# Patient Record
Sex: Male | Born: 1985 | Race: Black or African American | Hispanic: No | Marital: Single | Smoking: Current every day smoker
Health system: Southern US, Community
[De-identification: ages and names within clinical notes are randomized; demographics above are authoritative.]

## PROBLEM LIST (undated history)

## (undated) DIAGNOSIS — I2699 Other pulmonary embolism without acute cor pulmonale: Secondary | ICD-10-CM

## (undated) DIAGNOSIS — R569 Unspecified convulsions: Secondary | ICD-10-CM

## (undated) HISTORY — PX: FACIAL FRACTURE SURGERY: SHX1570

---

## 2009-04-16 ENCOUNTER — Emergency Department: Payer: Self-pay | Admitting: Internal Medicine

## 2009-08-12 ENCOUNTER — Emergency Department: Payer: Self-pay | Admitting: Emergency Medicine

## 2012-04-04 ENCOUNTER — Emergency Department: Payer: Self-pay | Admitting: Emergency Medicine

## 2015-04-26 ENCOUNTER — Encounter: Payer: Self-pay | Admitting: Emergency Medicine

## 2015-04-26 ENCOUNTER — Emergency Department
Admission: EM | Admit: 2015-04-26 | Discharge: 2015-04-26 | Disposition: A | Discharge status: Home / Self Care | Payer: Self-pay | Attending: Emergency Medicine | Admitting: Emergency Medicine

## 2015-04-26 ENCOUNTER — Emergency Department: Payer: Self-pay

## 2015-04-26 DIAGNOSIS — W228XXA Striking against or struck by other objects, initial encounter: Secondary | ICD-10-CM | POA: Insufficient documentation

## 2015-04-26 DIAGNOSIS — F17200 Nicotine dependence, unspecified, uncomplicated: Secondary | ICD-10-CM | POA: Insufficient documentation

## 2015-04-26 DIAGNOSIS — S80211A Abrasion, right knee, initial encounter: Secondary | ICD-10-CM | POA: Insufficient documentation

## 2015-04-26 DIAGNOSIS — S8001XA Contusion of right knee, initial encounter: Secondary | ICD-10-CM | POA: Insufficient documentation

## 2015-04-26 DIAGNOSIS — T148XXA Other injury of unspecified body region, initial encounter: Secondary | ICD-10-CM

## 2015-04-26 DIAGNOSIS — S40011A Contusion of right shoulder, initial encounter: Secondary | ICD-10-CM | POA: Insufficient documentation

## 2015-04-26 DIAGNOSIS — F329 Major depressive disorder, single episode, unspecified: Secondary | ICD-10-CM | POA: Insufficient documentation

## 2015-04-26 DIAGNOSIS — F32A Depression, unspecified: Secondary | ICD-10-CM

## 2015-04-26 DIAGNOSIS — Y999 Unspecified external cause status: Secondary | ICD-10-CM | POA: Insufficient documentation

## 2015-04-26 DIAGNOSIS — Y9389 Activity, other specified: Secondary | ICD-10-CM | POA: Insufficient documentation

## 2015-04-26 DIAGNOSIS — Z23 Encounter for immunization: Secondary | ICD-10-CM | POA: Insufficient documentation

## 2015-04-26 DIAGNOSIS — Y929 Unspecified place or not applicable: Secondary | ICD-10-CM | POA: Insufficient documentation

## 2015-04-26 MED ORDER — IBUPROFEN 600 MG PO TABS
600.0000 mg | ORAL_TABLET | Freq: Once | ORAL | Status: AC
  Administered 2015-04-26: 600 mg via ORAL
  Filled 2015-04-26: qty 1

## 2015-04-26 MED ORDER — TETANUS-DIPHTH-ACELL PERTUSSIS 5-2.5-18.5 LF-MCG/0.5 IM SUSP
0.5000 mL | Freq: Once | INTRAMUSCULAR | Status: AC
  Administered 2015-04-26: 0.5 mL via INTRAMUSCULAR
  Filled 2015-04-26: qty 0.5
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
## 2015-04-26 NOTE — ED Notes (Signed)
Pt now also reports that he feel depressed sometimes and feels like his significant other makes him feel like he is always wrong.  He would like to talk to someone and get things off his chest.  Denies SI or HI.  Denies auditory or visual hallucinations.

## 2015-06-28 ENCOUNTER — Emergency Department: Payer: Self-pay

## 2015-06-28 ENCOUNTER — Emergency Department
Admission: EM | Admit: 2015-06-28 | Discharge: 2015-06-28 | Disposition: A | Discharge status: Home / Self Care | Payer: Self-pay | Attending: Emergency Medicine | Admitting: Emergency Medicine

## 2015-06-28 ENCOUNTER — Encounter: Payer: Self-pay | Admitting: Emergency Medicine

## 2015-06-28 DIAGNOSIS — Y929 Unspecified place or not applicable: Secondary | ICD-10-CM | POA: Insufficient documentation

## 2015-06-28 DIAGNOSIS — S93601A Unspecified sprain of right foot, initial encounter: Secondary | ICD-10-CM | POA: Insufficient documentation

## 2015-06-28 DIAGNOSIS — F17200 Nicotine dependence, unspecified, uncomplicated: Secondary | ICD-10-CM | POA: Insufficient documentation

## 2015-06-28 DIAGNOSIS — Y999 Unspecified external cause status: Secondary | ICD-10-CM | POA: Insufficient documentation

## 2015-06-28 DIAGNOSIS — W2105XA Struck by basketball, initial encounter: Secondary | ICD-10-CM | POA: Insufficient documentation

## 2015-06-28 DIAGNOSIS — Y9367 Activity, basketball: Secondary | ICD-10-CM | POA: Insufficient documentation

## 2015-06-28 MED ORDER — IBUPROFEN 800 MG PO TABS: 800.0000 mg | ORAL_TABLET | Freq: Three times a day (TID) | ORAL | Status: DC | PRN

## 2015-06-28 NOTE — ED Provider Notes (Signed)
[facility] Emergency Department Provider Note  ____________________________________________  Time seen: Approximately 11:46 AM  I have reviewed the triage vital signs and the nursing notes.   HISTORY  Chief Complaint Foot Pain    HPI Todd Ware is a 30 y.o. male, NAD, who presents to the emergency department with one-day history of right lateral foot and ankle pain. He was playing basketball around 6 or 7pm last night when he landed on the outside of his right foot and he thought he heard a 'pop'. He is able to bear weight but with pain and denies radiation of pain. He denies head trauma, LOC, or any other injuries. He tried elevating last night, but it was too painful to maintain. Took some ibuprofen with no relief. He admits to swelling and pain of the top and outside of the right foot, which is exaserbated with touch or movement. He denies fever, chills, skin discoloration, numbness, tingling, or skin temperature changes.   History reviewed. No pertinent past medical history.  There are no active problems to display for this patient.   Past Surgical History  Procedure Laterality Date  . Facial fracture surgery      Current Outpatient Rx  Name  Route  Sig  Dispense  Refill  . ibuprofen (ADVIL,MOTRIN) 800 MG tablet   Oral   Take 1 tablet (800 mg total) by mouth every 8 (eight) hours as needed (pain).   21 tablet   0     Allergies Review of patient's allergies indicates no known allergies.  No family history on file.  Social History Social History  Substance Use Topics  . Smoking status: Current Every Day Smoker  . Smokeless tobacco: None  . Alcohol Use: Yes     Review of Systems  Constitutional: No fever/chills, fatigue Cardiovascular: No chest pain. Respiratory:No shortness of breath.  Gastrointestinal: No abdominal pain.  No nausea, vomiting.  Musculoskeletal: Pain and swelling of the outside of the right foot and ankle.  Negative for back pain.  Skin: Negative for rash, redness, swelling, bruising, open wounds or lacerations. Neurological: Negative for headaches, focal weakness or numbness. No LOC, dizziness, tingling. 10-point ROS otherwise negative.  ____________________________________________   PHYSICAL EXAM:  VITAL SIGNS: ED Triage Vitals  Enc Vitals Group     BP 06/28/15 1101 106/90 mmHg     Pulse Rate 06/28/15 1101 74     Resp 06/28/15 1101 20     Temp 06/28/15 1101 97.8 F (36.6 C)     Temp Source 06/28/15 1101 Oral     SpO2 06/28/15 1101 98 %     Weight 06/28/15 1101 195 lb (88.451 kg)     Height 06/28/15 1101 6' (1.829 m)     Head Cir --      Peak Flow --      Pain Score 06/28/15 1102 10     Pain Loc --      Pain Edu? --      Excl. in GC? --      Constitutional: Alert and oriented. Well appearing and in no acute distress. Eyes: Conjunctivae are normal.  Head: Atraumatic. Cardiovascular: Good peripheral circulation with 2+ pulses noted in the right lower extremity. Capillary refill is brisk in all digits of the right lower extremity. Respiratory: Normal respiratory effort without tachypnea or retractions.  Musculoskeletal: Swelling of the dorsum and lateral edge of right foot.  Full passive ROM at ankle and toes. Active ROM at ankle and toes limited by pain.  No left lower extremity tenderness nor edema.  No joint effusions. Neurologic:  Normal speech and language. No gross focal neurologic deficits are appreciated.  Skin:  Skin is warm, dry and intact. No rash, bruising, redness, open wounds noted. Psychiatric: Mood and affect are normal. Speech and behavior are normal. Patient exhibits appropriate insight and judgement.   ____________________________________________   LABS  None ____________________________________________  EKG  None ____________________________________________  RADIOLOGY I have personally viewed and evaluated these images (plain radiographs) as part  of my medical decision making, as well as reviewing the written report by the radiologist.  Dg Ankle Complete Right  06/28/2015  CLINICAL DATA:  30 year old male with right foot and ankle pain after a fall playing basketball yesterday. Initial encounter. EXAM: RIGHT ANKLE - COMPLETE 3+ VIEW COMPARISON:  Right foot series from today reported separately. FINDINGS: Bone mineralization is within normal limits. Mortise joint alignment is preserved. Talar dome intact. Calcaneus intact. Distal tibia and fibula intact. No acute osseous abnormality identified. IMPRESSION: No osseous abnormality identified about the right ankle. Electronically Signed   By: Odessa FlemingH  Hall M.D.   On: 06/28/2015 11:56   Dg Foot Complete Right  06/28/2015  CLINICAL DATA:  30 year old male with right foot and ankle pain after a fall playing basketball yesterday. Initial encounter. EXAM: RIGHT FOOT COMPLETE - 3+ VIEW COMPARISON:  None. FINDINGS: Bone mineralization is within normal limits. Calcaneus intact. Tarsal bones appear intact. Tarsal alignment is within normal limits. Metatarsals and phalanges appear intact. No acute osseous abnormality identified. IMPRESSION: No osseous abnormality identified in the right foot. Electronically Signed   By: Odessa FlemingH  Hall M.D.   On: 06/28/2015 11:54    ____________________________________________    PROCEDURES  Procedure(s) performed: None    Medications - No data to display   ____________________________________________   INITIAL IMPRESSION / ASSESSMENT AND PLAN / ED COURSE  Pertinent imaging results that were available during my care of the patient were reviewed by me and considered in my medical decision making (see chart for details).  Patient's diagnosis is consistent with right foot sprain. Patient was placed in an Ace wrap and given crutches for supportive care and to assist with ambulation. Patient will be discharged home with prescriptions for ibuprofen to take as directed. Patient is  to apply ice to the affected area 20 minutes 3-4 times daily and elevate the affected area as needed when not ambulating. [facility] if symptoms persist past this treatment course. Patient is given ED precautions to return to the ED for any worsening or new symptoms.    ____________________________________________  FINAL CLINICAL IMPRESSION(S) / ED DIAGNOSES  Final diagnoses:  Right foot sprain, initial encounter      NEW MEDICATIONS STARTED DURING THIS VISIT:  Discharge Medication List as of 06/28/2015 12:33 PM    START taking these medications   Details  ibuprofen (ADVIL,MOTRIN) 800 MG tablet Take 1 tablet (800 mg total) by mouth every 8 (eight) hours as needed (pain)., Starting 06/28/2015, Until Discontinued, Print             Hope PigeonJami L Hagler, PA-C 06/28/15 1255  Myrna Blazeravid Matthew Schaevitz, MD 06/28/15 1536

## 2015-06-28 NOTE — ED Notes (Signed)
States he stepped wrong while playing b/b yesterday  Pain and swelling noted to lateral right foot  Positive pulse  Area very tender to touch

## 2015-06-28 NOTE — Discharge Instructions (Signed)
Elastic Bandage and RICE °WHAT DOES AN ELASTIC BANDAGE DO? °Elastic bandages come in different shapes and sizes. They generally provide support to your injury and reduce swelling while you are healing, but they can perform different functions. Your health care provider will help you to decide what is best for your protection, recovery, or rehabilitation following an injury. °WHAT ARE SOME GENERAL TIPS FOR USING AN ELASTIC BANDAGE? °· Use the bandage as directed by the maker of the bandage that you are using. °· Do not wrap the bandage too tightly. This may cut off the circulation in the arm or leg in the area below the bandage. °· If part of your body beyond the bandage becomes blue, numb, cold, swollen, or is more painful, your bandage is most likely too tight. If this occurs, remove your bandage and reapply it more loosely. °· See your health care provider if the bandage seems to be making your problems worse rather than better. °· An elastic bandage should be removed and reapplied every 3-4 hours or as directed by your health care provider. °WHAT IS RICE? °The routine care of many injuries includes rest, ice, compression, and elevation (RICE therapy).  °Rest °Rest is required to allow your body to heal. Generally, you can resume your routine activities when you are comfortable and have been given permission by your health care provider. °Ice °Icing your injury helps to keep the swelling down and it reduces pain. Do not apply ice directly to your skin. °· Put ice in a plastic bag. °· Place a towel between your skin and the bag. °· Leave the ice on for 20 minutes, 2-3 times per day. °Do this for as long as you are directed by your health care provider. °Compression °Compression helps to keep swelling down, gives support, and helps with discomfort. Compression may be done with an elastic bandage. °Elevation °Elevation helps to reduce swelling and it decreases pain. If possible, your injured area should be placed at  or above the level of your heart or the center of your chest. °WHEN SHOULD I SEEK MEDICAL CARE? °You should seek medical care if: °· You have persistent pain and swelling. °· Your symptoms are getting worse rather than improving. °These symptoms may indicate that further evaluation or further X-rays are needed. Sometimes, X-rays may not show a small broken bone (fracture) until a number of days later. Make a follow-up appointment with your health care provider. Ask when your X-ray results will be ready. Make sure that you get your X-ray results. °WHEN SHOULD I SEEK IMMEDIATE MEDICAL CARE? °You should seek immediate medical care if: °· You have a sudden onset of severe pain at or below the area of your injury. °· You develop redness or increased swelling around your injury. °· You have tingling or numbness at or below the area of your injury that does not improve after you remove the elastic bandage. °  °This information is not intended to replace advice given to you by your health care provider. Make sure you discuss any questions you have with your health care provider. °  °Document Released: 07/04/2001 Document Revised: 10/03/2014 Document Reviewed: 08/28/2013 °Elsevier Interactive Patient Education ©2016 [employer] ° °Cryotherapy °Cryotherapy is when you put ice on your injury. Ice helps lessen pain and puffiness (swelling) after an injury. Ice works the best when you start using it in the first 24 to 48 hours after an injury. °HOME CARE °· Put a dry or damp towel between the ice   pack and your skin. °· You may press gently on the ice pack. °· Leave the ice on for no more than 10 to 20 minutes at a time. °· Check your skin after 5 minutes to make sure your skin is okay. °· Rest at least 20 minutes between ice pack uses. °· Stop using ice when your skin loses feeling (numbness). °· Do not use ice on someone who cannot tell you when it hurts. This includes small children and people with memory problems  (dementia). °GET HELP RIGHT AWAY IF: °· You have white spots on your skin. °· Your skin turns blue or pale. °· Your skin feels waxy or hard. °· Your puffiness gets worse. °MAKE SURE YOU:  °· Understand these instructions. °· Will watch your condition. °· Will get help right away if you are not doing well or get worse. °  °This information is not intended to replace advice given to you by your health care provider. Make sure you discuss any questions you have with your health care provider. °  °Document Released: 07/01/2007 Document Revised: 04/06/2011 Document Reviewed: 09/04/2010 °Elsevier Interactive Patient Education ©2016 [employer] ° °Foot Sprain °A foot sprain is an injury to one of the strong bands of tissue (ligaments) that connect and support the many bones in your feet. The ligament can be stretched too much or it can tear. A tear can be either partial or complete. The severity of the sprain depends on how much of the ligament was damaged or torn. °CAUSES °A foot sprain is usually caused by suddenly twisting or pivoting your foot. °RISK FACTORS °This injury is more likely to occur in people who: °· Play a sport, such as basketball or football. °· Exercise or play a sport without warming up. °· Start a new workout or sport. °· Suddenly increase how long or hard they exercise or play a sport. °SYMPTOMS °Symptoms of this condition start soon after an injury and include: °· Pain, especially in the arch of the foot. °· Bruising. °· Swelling. °· Inability to walk or use the foot to support body weight. °DIAGNOSIS °This condition is diagnosed with a medical history and physical exam. You may also have imaging tests, such as: °· X-rays to make sure there are no broken bones (fractures). °· MRI to see if the ligament has torn. °TREATMENT °Treatment varies depending on the severity of your sprain. Mild sprains can be treated with rest, ice, compression, and elevation (RICE). If your ligament is overstretched or  partially torn, treatment usually involves keeping your foot in a fixed position (immobilization) for a period of time. To help you do this, your health care provider will apply a bandage, splint, or walking boot to keep your foot from moving until it heals. You may also be advised to use crutches or a scooter for a few weeks to avoid bearing weight on your foot while it is healing. °If your ligament is fully torn, you may need surgery to reconnect the ligament to the bone. After surgery, a cast or splint will be applied and will need to stay on your foot while it heals. °Your health care provider may also suggest exercises or physical therapy to strengthen your foot. °HOME CARE INSTRUCTIONS °If You Have a Bandage, Splint, or Walking Boot: °· Wear it as directed by your health care provider. Remove it only as directed by your health care provider. °· Loosen the bandage, splint, or walking boot if your toes become numb and tingle, or   if they turn cold and blue. Bathing  If your health care provider approves bathing and showering, cover the bandage or splint with a watertight plastic bag to protect it from water. Do not let the bandage or splint get wet. Managing Pain, Stiffness, and Swelling   If directed, apply ice to the injured area:  Put ice in a plastic bag.  Place a towel between your skin and the bag.  Leave the ice on for 20 minutes, 2-3 times per day.  Move your toes often to avoid stiffness and to lessen swelling.  Raise (elevate) the injured area above the level of your heart while you are sitting or lying down. Driving  Do not drive or operate heavy machinery while taking pain medicine.  Do not drive while wearing a bandage, splint, or walking boot on a foot that you use for driving. Activity  Rest as directed by your health care provider.  Do not use the injured foot to support your body weight until your health care provider says that you can. Use crutches or other supportive  devices as directed by your health care provider.  Ask your health care provider what activities are safe for you. Gradually increase how much and how far you walk until your health care provider says it is safe to return to full activity.  Do any exercise or physical therapy as directed by your health care provider. General Instructions  If a splint was applied, do not put pressure on any part of it until it is fully hardened. This may take several hours.  Take medicines only as directed by your health care provider. These include over-the-counter medicines and prescription medicines.  Keep all follow-up visits as directed by your health care provider. This is important.  When you can walk without pain, wear supportive shoes that have stiff soles. Do not wear flip-flops, and do not walk barefoot. SEEK MEDICAL CARE IF:  Your pain is not controlled with medicine.  Your bruising or swelling gets worse or does not get better with treatment.  Your splint or walking boot is damaged. SEEK IMMEDIATE MEDICAL CARE IF:  Your foot is numb or blue.  Your foot feels colder than normal.   This information is not intended to replace advice given to you by your health care provider. Make sure you discuss any questions you have with your health care provider.   Document Released: 07/04/2001 Document Revised: 05/29/2014 Document Reviewed: 11/15/2013 Elsevier Interactive Patient Education [employer]

## 2015-06-28 NOTE — ED Notes (Signed)
Pt verbalized understanding of discharge instructions. NAD at this time. 

## 2016-09-01 ENCOUNTER — Emergency Department: Payer: Self-pay

## 2016-09-01 ENCOUNTER — Encounter: Payer: Self-pay | Admitting: *Deleted

## 2016-09-01 ENCOUNTER — Emergency Department
Admission: EM | Admit: 2016-09-01 | Discharge: 2016-09-01 | Disposition: A | Discharge status: Home / Self Care | Payer: Self-pay | Attending: Emergency Medicine | Admitting: Emergency Medicine

## 2016-09-01 DIAGNOSIS — R0789 Other chest pain: Secondary | ICD-10-CM | POA: Insufficient documentation

## 2016-09-01 DIAGNOSIS — F17200 Nicotine dependence, unspecified, uncomplicated: Secondary | ICD-10-CM | POA: Insufficient documentation

## 2016-09-01 LAB — BASIC METABOLIC PANEL
ANION GAP: 9 (ref 5–15)
BUN: 16 mg/dL (ref 6–20)
CALCIUM: 9.1 mg/dL (ref 8.9–10.3)
CHLORIDE: 103 mmol/L (ref 101–111)
CO2: 27 mmol/L (ref 22–32)
Creatinine, Ser: 1 mg/dL (ref 0.61–1.24)
GFR calc non Af Amer: >60 mL/min (ref >60)
Glucose, Bld: 90 mg/dL (ref 65–99)
Potassium: 3.7 mmol/L (ref 3.5–5.1)
SODIUM: 139 mmol/L (ref 135–145)

## 2016-09-01 LAB — CBC
HCT: 46.9 % (ref 40.0–52.0)
HEMOGLOBIN: 15.9 g/dL (ref 13.0–18.0)
MCH: 30.4 pg (ref 26.0–34.0)
MCHC: 33.9 g/dL (ref 32.0–36.0)
MCV: 89.8 fL (ref 80.0–100.0)
Platelets: 157 10*3/uL (ref 150–440)
RBC: 5.23 MIL/uL (ref 4.40–5.90)
RDW: 13.6 % (ref 11.5–14.5)
WBC: 8.5 10*3/uL (ref 3.8–10.6)

## 2016-09-01 LAB — TROPONIN I: Troponin I: <0.03 ng/mL (ref <0.03)

## 2016-09-01 MED ORDER — IBUPROFEN 600 MG PO TABS: 600.0000 mg | ORAL_TABLET | Freq: Three times a day (TID) | ORAL | 0 refills | Status: DC | PRN

## 2016-09-01 MED ORDER — IBUPROFEN 600 MG PO TABS
600.0000 mg | ORAL_TABLET | Freq: Once | ORAL | Status: AC
  Administered 2016-09-01: 600 mg via ORAL
  Filled 2016-09-01: qty 1

## 2016-09-01 NOTE — ED Triage Notes (Signed)
Pt brought in via ems.  Pt has chest pain and sob for several months   States pain is in left chest.  nonradiaiting pain   Pt reports pain with deep breath.  Pt alert.

## 2016-09-01 NOTE — Discharge Instructions (Signed)
Fortunately today her blood work and her chest x-ray were normal. Please take your pain medication as needed as prescribed and wants to return to [place] tomorrow and he can appointment to follow-up with your primary care physician this week for recheck. Return to the emergency department sooner for any concerns.  It was a pleasure to take care of you today, and thank you for coming to our emergency department.  If you have any questions or concerns before leaving please ask the nurse to grab me and I'm more than happy to go through your aftercare instructions again.  If you were prescribed any opioid pain medication today such as Norco, Vicodin, Percocet, morphine, hydrocodone, or oxycodone please make sure you do not drive when you are taking this medication as it can alter your ability to drive safely.  If you have any concerns once you are home that you are not improving or are in fact getting worse before you can make it to your follow-up appointment, please do not hesitate to call 911 and come back for further evaluation.  Merrily BrittleNeil Aleczander Fandino, MD  Results for orders placed or performed during the hospital encounter of 09/01/16  Basic metabolic panel  Result Value Ref Range   Sodium 139 135 - 145 mmol/L   Potassium 3.7 3.5 - 5.1 mmol/L   Chloride 103 101 - 111 mmol/L   CO2 27 22 - 32 mmol/L   Glucose, Bld 90 65 - 99 mg/dL   BUN 16 6 - 20 mg/dL   Creatinine, Ser 1.611.00 0.61 - 1.24 mg/dL   Calcium 9.1 8.9 - 09.610.3 mg/dL   GFR calc non Af Amer >60 >60 mL/min   GFR calc Af Amer >60 >60 mL/min   Anion gap 9 5 - 15  CBC  Result Value Ref Range   WBC 8.5 3.8 - 10.6 K/uL   RBC 5.23 4.40 - 5.90 MIL/uL   Hemoglobin 15.9 13.0 - 18.0 g/dL   HCT 04.546.9 40.940.0 - 81.152.0 %   MCV 89.8 80.0 - 100.0 fL   MCH 30.4 26.0 - 34.0 pg   MCHC 33.9 32.0 - 36.0 g/dL   RDW 91.413.6 78.211.5 - 95.614.5 %   Platelets 157 150 - 440 K/uL  Troponin I  Result Value Ref Range   Troponin I <0.03 <0.03 ng/mL   Dg Chest 2 View  Result  Date: 09/01/2016 CLINICAL DATA:  Chronic left-sided chest pain and shortness of breath. Initial encounter. EXAM: CHEST  2 VIEW COMPARISON:  None. FINDINGS: The lungs are well-aerated and clear. There is no evidence of focal opacification, pleural effusion or pneumothorax. The heart is normal in size; the mediastinal contour is within normal limits. No acute osseous abnormalities are seen. IMPRESSION: No acute cardiopulmonary process seen. Electronically Signed   By: Roanna RaiderJeffery  Chang M.D.   On: 09/01/2016 02:23

## 2016-09-01 NOTE — ED Notes (Signed)
ED Provider at bedside. 

## 2016-09-01 NOTE — ED Provider Notes (Signed)
[facility] Emergency Department Provider Note  ____________________________________________   First MD Initiated Contact with Patient 09/01/16 0405     (approximate)  I have reviewed the triage vital signs and the nursing notes.   HISTORY  Chief Complaint Shortness of Breath   HPI Todd Ware is a 31 y.o. male who self presents to the emergency department with 2-3 weeks of squeezing upper chest pain. The pain is difficult for him to quantify. It is associated with mild shortness of breath. He has no history of DVT or pulmonary embolism. He lives in [place] and has recently been in [place]. He said that it one year ago he had a similar episode and was told that he needed to follow up with the cardiologist however he never did. He denies syncope. His pain is nonradiating. Nothing seems to make it better or worse. It is nonexertional.   No past medical history on file.  There are no active problems to display for this patient.   No past surgical history on file.  Prior to Admission medications   Medication Sig Start Date End Date Taking? Authorizing Provider  ibuprofen (ADVIL,MOTRIN) 600 MG tablet Take 1 tablet (600 mg total) by mouth every 8 (eight) hours as needed. 09/01/16   Merrily Brittle, MD    Allergies Patient has no known allergies.  No family history on file.  Social History Social History  Substance Use Topics  . Smoking status: Current Every Day Smoker  . Smokeless tobacco: Never Used  . Alcohol use No    Review of Systems Constitutional: No fever/chills ENT: No sore throat. Cardiovascular: Positive chest pain. Respiratory: Positive shortness of breath. Gastrointestinal: No abdominal pain.  No nausea, no vomiting.  No diarrhea.  No constipation. Musculoskeletal: Negative for back pain. Neurological: Negative for headaches   ____________________________________________   PHYSICAL EXAM:  VITAL SIGNS: ED Triage  Vitals  Enc Vitals Group     BP 09/01/16 0153 132/83     Pulse Rate 09/01/16 0153 82     Resp 09/01/16 0153 18     Temp 09/01/16 0153 98.3 F (36.8 C)     Temp Source 09/01/16 0153 Oral     SpO2 09/01/16 0153 98 %     Weight 09/01/16 0147 168 lb (76.2 kg)     Height 09/01/16 0147 6' (1.829 m)     Head Circumference --      Peak Flow --      Pain Score 09/01/16 0146 10     Pain Loc --      Pain Edu? --      Excl. in GC? --     Constitutional: Alert and oriented 4 pleasant cooperative speaks full clear sentences Head: Atraumatic. Nose: No congestion/rhinnorhea. Mouth/Throat: No trismus Neck: No stridor.   Cardiovascular: Regular rate and rhythm no murmurs Respiratory: Normal respiratory effort.  No retractions. Musculoskeletal: Legs are equal in size no edema Neurologic:  Normal speech and language. No gross focal neurologic deficits are appreciated.  Skin:  Skin is warm, dry and intact. No rash noted.    ____________________________________________  LABS (all labs ordered are listed, but only abnormal results are displayed)  Labs Reviewed  BASIC METABOLIC PANEL  CBC  TROPONIN I    No signs of acute ischemia __________________________________________  EKG  ED ECG REPORT I, Merrily Brittle, the attending physician, personally viewed and interpreted this ECG.  Date: 09/01/2016 Rate: 73 Rhythm: normal sinus rhythm QRS Axis: normal Intervals:  normal ST/T Wave abnormalities: normal Narrative Interpretation: Incomplete right bundle branch block but no Brugada pattern borderline EKG  ____________________________________________  RADIOLOGY  Chest x-ray with no acute disease ____________________________________________   PROCEDURES  Procedure(s) performed: no  Procedures  Critical Care performed: no  Observation: no ____________________________________________   INITIAL IMPRESSION / ASSESSMENT AND PLAN / ED COURSE  Pertinent labs & imaging results  that were available during my care of the patient were reviewed by me and considered in my medical decision making (see chart for details).  The patient is very well-appearing with atypical chest pain. I appreciate his incomplete right bundle branch block but this is nonspecific. He is PERC negative.  Doubt myocardial ischemia. He is leaving for [place] tomorrow and I encouraged him to reestablish care with his primary care physician once he arrives. He is discharged home in improved condition.      ____________________________________________   FINAL CLINICAL IMPRESSION(S) / ED DIAGNOSES  Final diagnoses:  Atypical chest pain      NEW MEDICATIONS STARTED DURING THIS VISIT:  Discharge Medication List as of 09/01/2016  5:23 AM    START taking these medications   Details  ibuprofen (ADVIL,MOTRIN) 600 MG tablet Take 1 tablet (600 mg total) by mouth every 8 (eight) hours as needed., Starting Tue 09/01/2016, Print         Note:  This document was prepared using Dragon voice recognition software and may include unintentional dictation errors.      Merrily Brittleifenbark, Ishana Blades, MD 09/01/16 [phone]

## 2016-09-02 ENCOUNTER — Encounter: Payer: Self-pay | Admitting: *Deleted

## 2017-05-26 ENCOUNTER — Encounter: Payer: Self-pay | Admitting: Emergency Medicine

## 2017-05-26 ENCOUNTER — Emergency Department
Admission: EM | Admit: 2017-05-26 | Discharge: 2017-05-26 | Disposition: A | Discharge status: Home / Self Care | Payer: Self-pay | Attending: Emergency Medicine | Admitting: Emergency Medicine

## 2017-05-26 ENCOUNTER — Emergency Department: Payer: Self-pay

## 2017-05-26 ENCOUNTER — Other Ambulatory Visit: Payer: Self-pay

## 2017-05-26 DIAGNOSIS — F17200 Nicotine dependence, unspecified, uncomplicated: Secondary | ICD-10-CM | POA: Insufficient documentation

## 2017-05-26 DIAGNOSIS — L03116 Cellulitis of left lower limb: Secondary | ICD-10-CM | POA: Insufficient documentation

## 2017-05-26 LAB — CBC WITH DIFFERENTIAL/PLATELET
BASOS ABS: 0 10*3/uL (ref 0–0.1)
BASOS PCT: 0 %
Eosinophils Absolute: 0 10*3/uL (ref 0–0.7)
Eosinophils Relative: 0 %
HCT: 42.4 % (ref 40.0–52.0)
Hemoglobin: 14.1 g/dL (ref 13.0–18.0)
LYMPHS PCT: 34 %
Lymphs Abs: 1.1 10*3/uL (ref 1.0–3.6)
MCH: 30.3 pg (ref 26.0–34.0)
MCHC: 33.2 g/dL (ref 32.0–36.0)
MCV: 91.3 fL (ref 80.0–100.0)
Monocytes Absolute: 0.3 10*3/uL (ref 0.2–1.0)
Monocytes Relative: 8 %
NEUTROS ABS: 1.9 10*3/uL (ref 1.4–6.5)
Neutrophils Relative %: 58 %
Platelets: 139 10*3/uL — ABNORMAL LOW (ref 150–440)
RBC: 4.65 MIL/uL (ref 4.40–5.90)
RDW: 13.9 % (ref 11.5–14.5)
WBC: 3.3 10*3/uL — AB (ref 3.8–10.6)

## 2017-05-26 LAB — BASIC METABOLIC PANEL
ANION GAP: 6 (ref 5–15)
BUN: 7 mg/dL (ref 6–20)
CALCIUM: 8.3 mg/dL — AB (ref 8.9–10.3)
CO2: 28 mmol/L (ref 22–32)
Chloride: 103 mmol/L (ref 101–111)
Creatinine, Ser: 0.69 mg/dL (ref 0.61–1.24)
GFR calc non Af Amer: >60 mL/min (ref >60)
GLUCOSE: 96 mg/dL (ref 65–99)
POTASSIUM: 3.7 mmol/L (ref 3.5–5.1)
Sodium: 137 mmol/L (ref 135–145)

## 2017-05-26 LAB — LACTIC ACID, PLASMA: LACTIC ACID, VENOUS: 0.7 mmol/L (ref 0.5–1.9)

## 2017-05-26 MED ORDER — CLINDAMYCIN PHOSPHATE 600 MG/50ML IV SOLN
600.0000 mg | Freq: Once | INTRAVENOUS | Status: AC
  Administered 2017-05-26: 600 mg via INTRAVENOUS
  Filled 2017-05-26: qty 50

## 2017-05-26 MED ORDER — OXYCODONE HCL 5 MG PO TABS
5.0000 mg | ORAL_TABLET | Freq: Once | ORAL | Status: AC
  Administered 2017-05-26: 5 mg via ORAL
  Filled 2017-05-26: qty 1

## 2017-05-26 MED ORDER — ACETAMINOPHEN 500 MG PO TABS
1000.0000 mg | ORAL_TABLET | Freq: Once | ORAL | Status: AC
  Administered 2017-05-26: 1000 mg via ORAL
  Filled 2017-05-26: qty 2

## 2017-05-26 MED ORDER — CLINDAMYCIN HCL 150 MG PO CAPS: 450.0000 mg | ORAL_CAPSULE | Freq: Three times a day (TID) | ORAL | 0 refills | Status: AC

## 2017-05-26 NOTE — ED Notes (Signed)
First Nurse Note:  Patient ambulatory to Rm 9, Tresa Endo RN aware of placement.

## 2017-05-26 NOTE — ED Notes (Signed)
Pt's care discussed with Dr. Alphonzo Lemmings, see orders.
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
## 2017-05-26 NOTE — ED Notes (Signed)
Pt taken to lobby to wait for ride in wheelchair. VSS. NAD. Discharge instructions, RX and follow up discussed with pt. All questions answered.

## 2017-05-26 NOTE — ED Triage Notes (Signed)
Pt presents to ED via POV with c/o L calf swelling and pain x 2 weeks. Pt states pain worse with ambulation at this time. Pt states pain became unbearable earlier today.

## 2017-05-26 NOTE — ED Notes (Addendum)
Pt with significant swelling to L calf with noted rash, redness noted as well.

## 2017-09-23 IMAGING — CR DG CHEST 2V
2 series · 2 of 2 positions shown · non-contrast
Comparison: None.

CLINICAL DATA: Chronic left-sided chest pain and shortness of
breath. Initial encounter.

EXAM:
CHEST  2 VIEW

[chest pa]
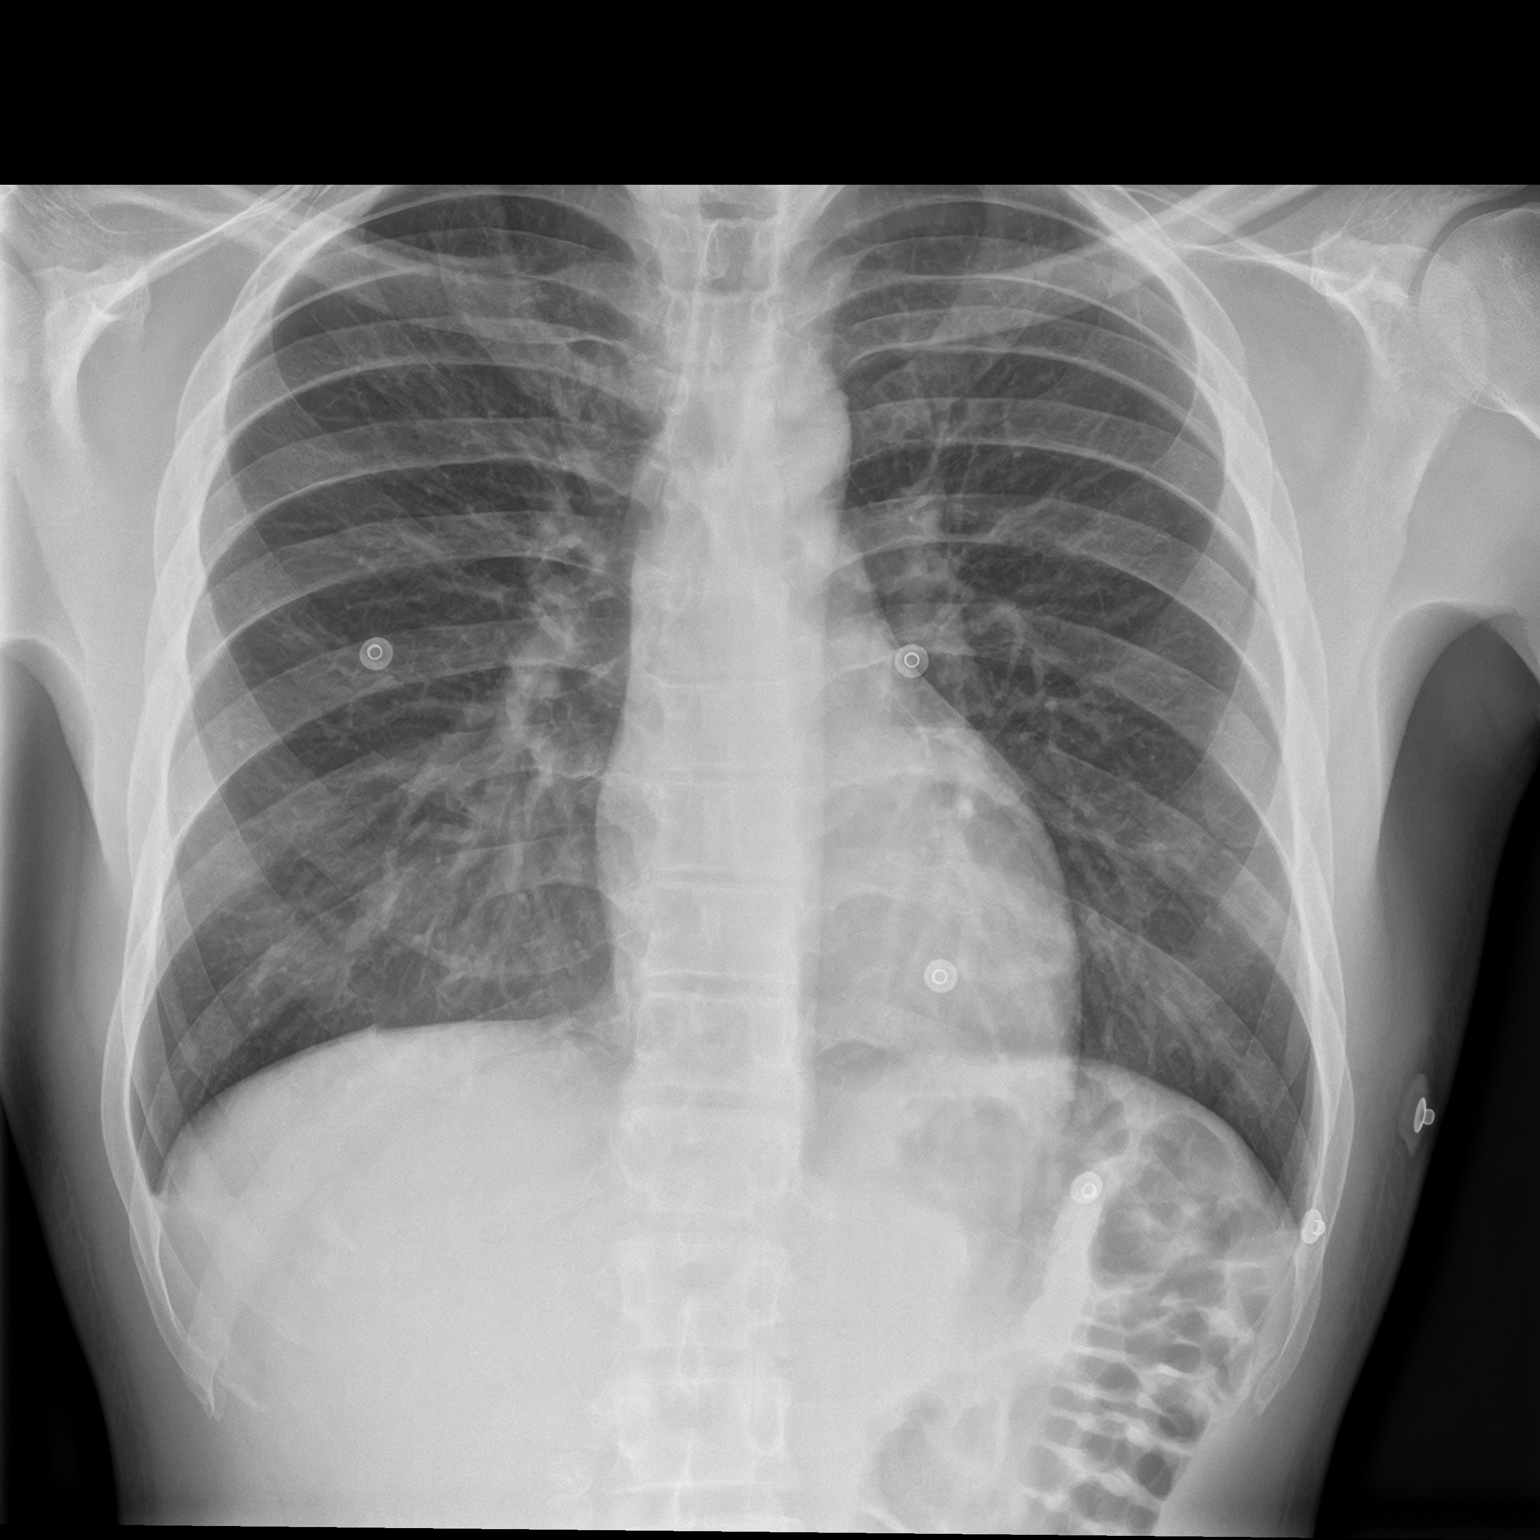

[chest lat]
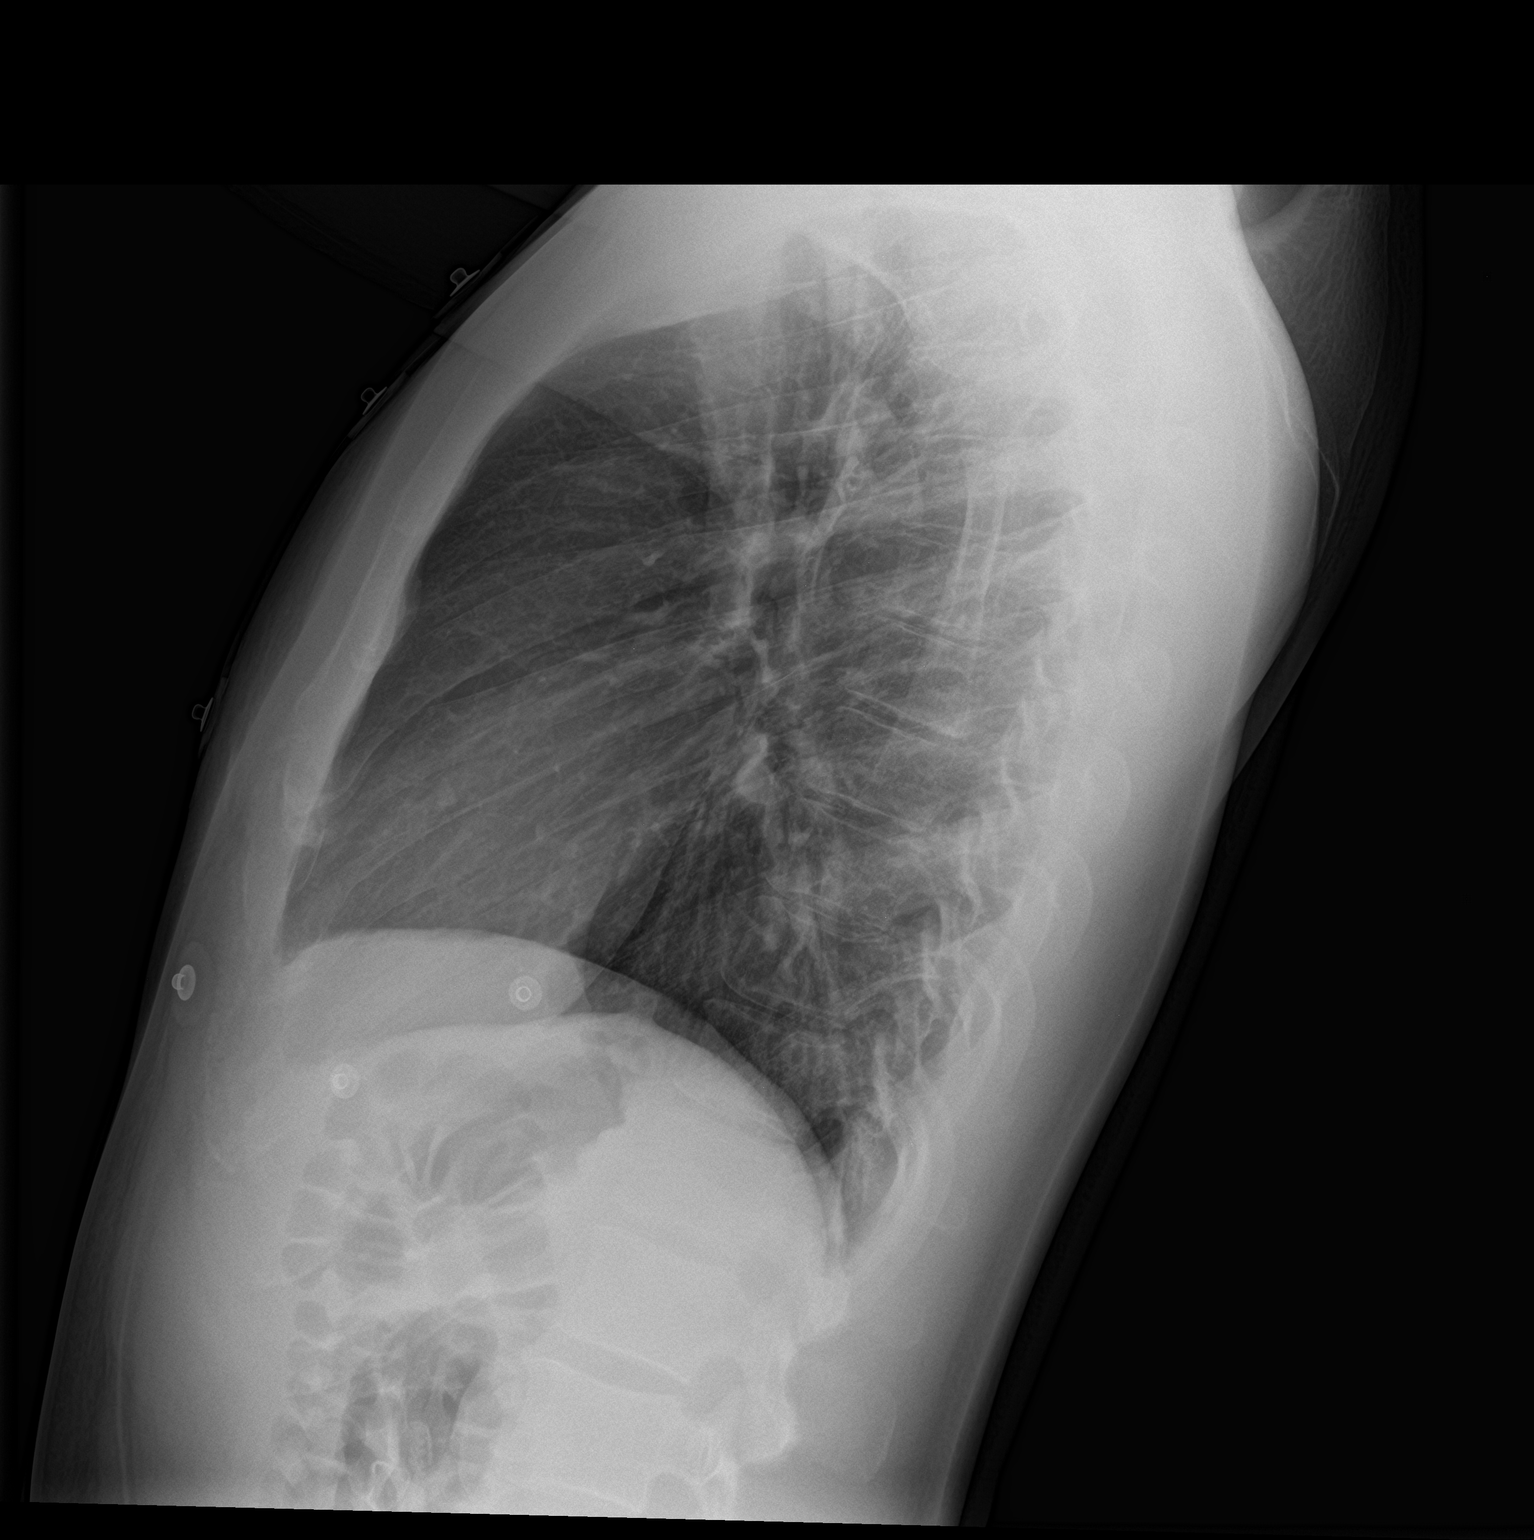

[2 of 2 positions shown; findings below may reference images not displayed]

FINDINGS: The lungs are well-aerated and clear. There is no evidence of focal
opacification, pleural effusion or pneumothorax.

The heart is normal in size; the mediastinal contour is within
normal limits. No acute osseous abnormalities are seen.
IMPRESSION: No acute cardiopulmonary process seen.

## 2018-08-01 ENCOUNTER — Ambulatory Visit: Payer: Self-pay

## 2018-08-10 ENCOUNTER — Other Ambulatory Visit: Payer: Self-pay

## 2018-08-10 ENCOUNTER — Emergency Department
Admission: EM | Admit: 2018-08-10 | Discharge: 2018-08-10 | Disposition: A | Discharge status: Other Acute Inpatient Hospital | Payer: Self-pay | Attending: Emergency Medicine | Admitting: Emergency Medicine

## 2018-08-10 ENCOUNTER — Inpatient Hospital Stay: Admission: RE | Admit: 2018-08-10 | Payer: Self-pay | Source: Intra-hospital | Admitting: Psychiatry

## 2018-08-10 ENCOUNTER — Inpatient Hospital Stay
Admission: AD | Admit: 2018-08-10 | Discharge: 2018-08-12 | DRG: 885 | Disposition: A | Discharge status: Home / Self Care | Payer: No Typology Code available for payment source | Source: Intra-hospital | Attending: Psychiatry | Admitting: Psychiatry

## 2018-08-10 ENCOUNTER — Encounter: Payer: Self-pay | Admitting: Emergency Medicine

## 2018-08-10 DIAGNOSIS — G4700 Insomnia, unspecified: Secondary | ICD-10-CM | POA: Diagnosis present

## 2018-08-10 DIAGNOSIS — Z62810 Personal history of physical and sexual abuse in childhood: Secondary | ICD-10-CM | POA: Diagnosis present

## 2018-08-10 DIAGNOSIS — F3112 Bipolar disorder, current episode manic without psychotic features, moderate: Principal | ICD-10-CM | POA: Diagnosis present

## 2018-08-10 DIAGNOSIS — F319 Bipolar disorder, unspecified: Secondary | ICD-10-CM

## 2018-08-10 DIAGNOSIS — Z20828 Contact with and (suspected) exposure to other viral communicable diseases: Secondary | ICD-10-CM | POA: Insufficient documentation

## 2018-08-10 DIAGNOSIS — F17210 Nicotine dependence, cigarettes, uncomplicated: Secondary | ICD-10-CM | POA: Insufficient documentation

## 2018-08-10 DIAGNOSIS — F250 Schizoaffective disorder, bipolar type: Secondary | ICD-10-CM | POA: Insufficient documentation

## 2018-08-10 DIAGNOSIS — Z590 Homelessness: Secondary | ICD-10-CM | POA: Diagnosis not present

## 2018-08-10 DIAGNOSIS — F411 Generalized anxiety disorder: Secondary | ICD-10-CM | POA: Diagnosis present

## 2018-08-10 DIAGNOSIS — R45851 Suicidal ideations: Secondary | ICD-10-CM | POA: Diagnosis present

## 2018-08-10 DIAGNOSIS — R45850 Homicidal ideations: Secondary | ICD-10-CM | POA: Insufficient documentation

## 2018-08-10 LAB — SALICYLATE LEVEL: Salicylate Lvl: <7 mg/dL (ref 2.8–30.0)

## 2018-08-10 LAB — CBC
HCT: 47.6 % (ref 39.0–52.0)
Hemoglobin: 15.8 g/dL (ref 13.0–17.0)
MCH: 29.9 pg (ref 26.0–34.0)
MCHC: 33.2 g/dL (ref 30.0–36.0)
MCV: 90.2 fL (ref 80.0–100.0)
Platelets: 149 10*3/uL — ABNORMAL LOW (ref 150–400)
RBC: 5.28 MIL/uL (ref 4.22–5.81)
RDW: 13.2 % (ref 11.5–15.5)
WBC: 6.4 10*3/uL (ref 4.0–10.5)
nRBC: 0 % (ref 0.0–0.2)

## 2018-08-10 LAB — ACETAMINOPHEN LEVEL: Acetaminophen (Tylenol), Serum: <10 ug/mL — ABNORMAL LOW (ref 10–30)

## 2018-08-10 LAB — COMPREHENSIVE METABOLIC PANEL
ALT: 11 U/L (ref 0–44)
AST: 19 U/L (ref 15–41)
Albumin: 3.7 g/dL (ref 3.5–5.0)
Alkaline Phosphatase: 47 U/L (ref 38–126)
Anion gap: 8 (ref 5–15)
BUN: 8 mg/dL (ref 6–20)
CO2: 27 mmol/L (ref 22–32)
Calcium: 9 mg/dL (ref 8.9–10.3)
Chloride: 104 mmol/L (ref 98–111)
Creatinine, Ser: 0.83 mg/dL (ref 0.61–1.24)
GFR calc Af Amer: >60 mL/min (ref >60)
GFR calc non Af Amer: >60 mL/min (ref >60)
Glucose, Bld: 104 mg/dL — ABNORMAL HIGH (ref 70–99)
Potassium: 3.6 mmol/L (ref 3.5–5.1)
Sodium: 139 mmol/L (ref 135–145)
Total Bilirubin: 1 mg/dL (ref 0.3–1.2)
Total Protein: 6.4 g/dL — ABNORMAL LOW (ref 6.5–8.1)

## 2018-08-10 LAB — ETHANOL: Alcohol, Ethyl (B): <10 mg/dL (ref <10)

## 2018-08-10 LAB — SARS CORONAVIRUS 2 BY RT PCR (HOSPITAL ORDER, PERFORMED IN ~~LOC~~ HOSPITAL LAB): SARS Coronavirus 2: NEGATIVE

## 2018-08-10 MED ORDER — DIAZEPAM 5 MG PO TABS
10.0000 mg | ORAL_TABLET | Freq: Once | ORAL | Status: AC
  Administered 2018-08-10: 14:00:00 10 mg via ORAL
  Filled 2018-08-10: qty 2

## 2018-08-10 MED ORDER — LORAZEPAM 2 MG PO TABS
2.0000 mg | ORAL_TABLET | Freq: Once | ORAL | Status: AC
  Administered 2018-08-10: 2 mg via ORAL
  Filled 2018-08-10: qty 1

## 2018-08-10 MED ORDER — ALUM & MAG HYDROXIDE-SIMETH 200-200-20 MG/5ML PO SUSP: 30.0000 mL | ORAL | Status: DC | PRN

## 2018-08-10 MED ORDER — HALOPERIDOL LACTATE 5 MG/ML IJ SOLN: 20.0000 mg | Freq: Once | INTRAMUSCULAR | Status: DC

## 2018-08-10 MED ORDER — ACETAMINOPHEN 325 MG PO TABS: 650.0000 mg | ORAL_TABLET | Freq: Four times a day (QID) | ORAL | Status: DC | PRN

## 2018-08-10 MED ORDER — MAGNESIUM HYDROXIDE 400 MG/5ML PO SUSP: 30.0000 mL | Freq: Every day | ORAL | Status: DC | PRN

## 2018-08-10 MED ORDER — ZIPRASIDONE MESYLATE 20 MG IM SOLR
20.0000 mg | Freq: Once | INTRAMUSCULAR | Status: DC
  Filled 2018-08-10: qty 20

## 2018-08-10 NOTE — ED Notes (Signed)
Pt given crackers.  

## 2018-08-10 NOTE — ED Notes (Signed)
Pt talking on phone and appears very calm and collected. Pt laughing with person on the phone. Laying down in NAD. Meal tray given.
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
## 2018-08-10 NOTE — ED Notes (Signed)
Pt discharged to BMU under IVC. VS stable. All belongings sent with patient. Report given to Cristie Hem, RN.
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
## 2018-08-10 NOTE — ED Notes (Signed)
Snack tray left at bedside, pt resting.

## 2018-08-10 NOTE — ED Notes (Signed)
Pt comes out of room and is upset why he is still here. Pt becomes very agitated and starts yelling when this RN begins to explain things. Pt often yells over this RN many times and asked for psychiatrist. Pt wants to leave and expresses that psychiatrist wanted him to go home. This RN explains that sometimes it's a wait for paperwork etc. Marya Amsler, RN and Calvin to bedside to mediate with patient.
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
## 2018-08-10 NOTE — BH Assessment (Signed)
Patient is to be admitted to [facility] by Dr. Einar Grad.  Attending Physician will be Dr. Einar Grad.   Patient has been assigned to room 307, by Violet Nurse 307.   ER staff is aware of the admission:  Glenda, ER Secretary    Dr. Cinda Quest, ER MD   Clarita Crane) Patient's Nurse   Gust Rung., Patient Access.

## 2018-08-10 NOTE — ED Notes (Signed)
[employer] officer Duwayne Heck who rode with patient [facility] agreed to stay with patient due to SI/HI that was voiced at [facility]. Pt to be dressed out by Nicki Reaper, EDT, and placed in family room with [employer] officer Duwayne Heck at this time.
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
## 2018-08-10 NOTE — ED Notes (Signed)
Pt dressed out. Pt belongings bag:  1 blue lighter 1 blue bandana 1 Photo ID 1 Bank card 1 Food Card (EBT) 1 grey shirt 1 grey sweatshirt 1 red shorts 1 pair shoes 1 pair socks

## 2018-08-10 NOTE — ED Notes (Signed)
Pt repeats "I don't know" many times when asked questions. Pt states that he doesn't really know why he is here. He says that he just cannot control his emotions anymore. He says his depression is really bad, although he denies any SI/HI. He says that he doesn't sleep any more than an hour at a time. He speaks very rapidly and repeats many things. He states that he wants some help. He states that hs ex girlfriend will be able to give a better story, and he says that she has told him many times that she thinks he "is bipolar." He is calm, cooperative. Oriented to bathroom, trashcan, meal times, phone privileges, etc and is agreeable to them. Eating a meal tray at this time.
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
## 2018-08-11 ENCOUNTER — Other Ambulatory Visit: Payer: Self-pay

## 2018-08-11 DIAGNOSIS — F319 Bipolar disorder, unspecified: Secondary | ICD-10-CM

## 2018-08-11 MED ORDER — LORAZEPAM 1 MG PO TABS
1.0000 mg | ORAL_TABLET | ORAL | Status: AC
  Administered 2018-08-11: 1 mg via ORAL
  Filled 2018-08-11: qty 1

## 2018-08-11 MED ORDER — QUETIAPINE FUMARATE 100 MG PO TABS
100.0000 mg | ORAL_TABLET | Freq: Every day | ORAL | Status: DC
  Filled 2018-08-11: qty 1

## 2018-08-11 MED ORDER — LORAZEPAM 1 MG PO TABS: 1.0000 mg | ORAL_TABLET | Freq: Four times a day (QID) | ORAL | Status: DC | PRN

## 2018-08-11 MED ORDER — LORAZEPAM 1 MG PO TABS: 1.0000 mg | ORAL_TABLET | ORAL | Status: DC | PRN

## 2018-08-11 MED ORDER — QUETIAPINE FUMARATE 100 MG PO TABS: 100.0000 mg | ORAL_TABLET | Freq: Every day | ORAL | 0 refills | Status: DC

## 2018-08-11 NOTE — BHH Counselor (Signed)
CSW attempted to complete PSA 2x this morning with pt. Pt was sleeping and unable to be roused.  Evalina Field, MSW, LCSW Clinical Social Work 08/11/2018 10:00 AM

## 2018-08-11 NOTE — Progress Notes (Signed)
Recreation Therapy Notes   Date: 08/11/2018  Time: 9:30 am   Location: Craft room   Behavioral response: N/A   Intervention Topic: Self-esteem  Discussion/Intervention: Patient did not attend group.   Clinical Observations/Feedback:  Patient did not attend group.   Arris Meyn LRT/CTRS        Aarion Metzgar 08/11/2018 10:28 AM

## 2018-08-11 NOTE — BHH Group Notes (Signed)
LCSW Group Therapy Note  08/11/2018 1:00 PM  Type of Therapy/Topic:  Group Therapy:  Balance in Life  Participation Level:  Did Not Attend  Description of Group:    This group will address the concept of balance and how it feels and looks when one is unbalanced. Patients will be encouraged to process areas in their lives that are out of balance and identify reasons for remaining unbalanced. Facilitators will guide patients in utilizing problem-solving interventions to address and correct the stressor making their life unbalanced. Understanding and applying boundaries will be explored and addressed for obtaining and maintaining a balanced life. Patients will be encouraged to explore ways to assertively make their unbalanced needs known to significant others in their lives, using other group members and facilitator for support and feedback.  Therapeutic Goals: 1. Patient will identify two or more emotions or situations they have that consume much of in their lives. 2. Patient will identify signs/triggers that life has become out of balance:  3. Patient will identify two ways to set boundaries in order to achieve balance in their lives:  4. Patient will demonstrate ability to communicate their needs through discussion and/or role plays  Summary of Patient Progress: X  Therapeutic Modalities:   Cognitive Behavioral Therapy Solution-Focused Therapy Assertiveness Training  Jebadiah Imperato MSW, LCSW 08/11/2018 10:37 AM

## 2018-08-11 NOTE — Progress Notes (Signed)
Patient denies SI/HI, denies A/V hallucinations. Patient verbalizes understanding of discharge instructions, follow up care and prescriptions.7 days medicines given to patient. Patient given all belongings from [facility] locker. Patient escorted out by staff, transported by family. 

## 2018-08-11 NOTE — Plan of Care (Signed)
Patient was irritable,sleepy and drowsy this morning.Did not eat breakfast or lunch.Patient was asking for discharge and medicine to calm down.Ativan given.Patient is appropriate at this time.Denies SI,HI and AVH.Support and encouragement given.
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
## 2018-08-11 NOTE — BHH Suicide Risk Assessment (Signed)
[facility] Admission Suicide Risk Assessment   Nursing information obtained from:  Patient Demographic factors:  NA Current Mental Status:  NA Loss Factors:  NA Historical Factors:  NA Risk Reduction Factors:  NA  Total Time spent with patient: 1 hour Principal Problem: Bipolar depression ([place]) Diagnosis:  Principal Problem:   Bipolar depression ([place]) Active Problems:   Bipolar 1 disorder with moderate mania ([place])  Subjective Data: Patient seen and chart reviewed.  Patient came to the emergency room initially as a voluntary patient with complaints of acute on chronic mood instability anxiety racing thoughts irritability.  Patient is currently in a stressful situation and only recently became homeless.  He describes symptoms of poor sleep nightly with nightmares, intense social anxiety, irritable mood.  He does not report any clearly psychotic symptoms.  He denies recent alcohol or drug abuse.  The patient denies any suicidal thoughts wish or plan.  Denies any violent thought or wish.  Continued Clinical Symptoms:  Alcohol Use Disorder Identification Test Final Score (AUDIT): 9 The "Alcohol Use Disorders Identification Test", Guidelines for Use in Primary Care, Second Edition.  World Pharmacologist [facility]). Score between 0-7:  no or low risk or alcohol related problems. Score between 8-15:  moderate risk of alcohol related problems. Score between 16-19:  high risk of alcohol related problems. Score 20 or above:  warrants further diagnostic evaluation for alcohol dependence and treatment.   CLINICAL FACTORS:   Depression:   Impulsivity   Musculoskeletal: Strength & Muscle Tone: within normal limits Gait & Station: normal Patient leans: N/A  Psychiatric Specialty Exam: Physical Exam  Nursing note and vitals reviewed. Constitutional: He appears well-developed and well-nourished.  HENT:  Head: Normocephalic and atraumatic.  Eyes: Pupils are equal, round, and reactive to light.  Conjunctivae are normal.  Neck: Normal range of motion.  Cardiovascular: Normal heart sounds.  Respiratory: Effort normal. No respiratory distress.  GI: Soft.  Musculoskeletal: Normal range of motion.  Neurological: He is alert.  Skin: Skin is warm and dry.  Psychiatric: Judgment normal. His mood appears anxious. His affect is blunt. His speech is rapid and/or pressured. He is agitated. He is not aggressive, not hyperactive and not combative. Thought content is paranoid. Thought content is not delusional. Cognition and memory are normal. He expresses no homicidal and no suicidal ideation.    Review of Systems  Constitutional: Negative.   HENT: Negative.   Eyes: Negative.   Respiratory: Negative.   Cardiovascular: Negative.   Gastrointestinal: Negative.   Musculoskeletal: Negative.   Skin: Negative.   Neurological: Negative.   Psychiatric/Behavioral: Positive for depression. Negative for hallucinations, memory loss, substance abuse and suicidal ideas. The patient is nervous/anxious and has insomnia.     Blood pressure 121/68, pulse 79, temperature 98.5 F (36.9 C), temperature source Oral, resp. rate 16, height 6' (1.829 m), weight 83.9 kg, SpO2 100 %.Body mass index is 25.09 kg/m.  General Appearance: Casual  Eye Contact:  Minimal  Speech:  Slow  Volume:  Decreased  Mood:  Anxious and Dysphoric  Affect:  Congruent  Thought Process:  Coherent  Orientation:  Full (Time, Place, and Person)  Thought Content:  Logical  Suicidal Thoughts:  No  Homicidal Thoughts:  No  Memory:  Immediate;   Fair Recent;   Fair Remote;   Fair  Judgement:  Fair  Insight:  Shallow  Psychomotor Activity:  Decreased  Concentration:  Concentration: Fair  Recall:  [employer] of Knowledge:  Fair  Language:  Fair  Akathisia:  No  Handed:  Right  AIMS (if indicated):     Assets:  Desire for Improvement Physical Health Resilience  ADL's:  Intact  Cognition:  WNL  Sleep:  Number of Hours: 5.15       COGNITIVE FEATURES THAT CONTRIBUTE TO RISK:  Polarized thinking    SUICIDE RISK:   Minimal: No identifiable suicidal ideation.  Patients presenting with no risk factors but with morbid ruminations; may be classified as minimal risk based on the severity of the depressive symptoms  PLAN OF CARE: Patient with what sounds like chronic mood lability and chronic anxiety who currently is having an increase in mood problems irritability and poor sleep.  Denies any suicidal or homicidal ideation.  No evidence of any recent violence towards self or others patient does not appear to meet criteria for forced or longer hospitalization.  Plan is to start low-dose of quetiapine this evening and plan for likely discharge tomorrow.  I certify that inpatient services furnished can reasonably be expected to improve the patient's condition.   Mordecai RasmussenJohn Fanny Agan, MD 08/11/2018, 4:00 PM
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
## 2018-08-12 MED ORDER — QUETIAPINE FUMARATE 100 MG PO TABS: 100.0000 mg | ORAL_TABLET | Freq: Every day | ORAL | 2 refills | Status: DC

## 2018-08-12 NOTE — Progress Notes (Signed)
Recreation Therapy Notes  Date: 08/12/2018  Time: 9:30 am   Location: Craft room   Behavioral response: N/A   Intervention Topic: Leisure  Discussion/Intervention: Patient did not attend group.   Clinical Observations/Feedback:  Patient did not attend group.   Damonie Furney LRT/CTRS         Waller Marcussen 08/12/2018 10:33 AM

## 2018-08-12 NOTE — Progress Notes (Signed)
Patient denies SI/HI, denies A/V hallucinations. Patient verbalizes understanding of discharge instructions, follow up care and prescriptions.7 days medicines given to patient. Patient given all belongings from [facility] locker. Patient escorted out by staff, transported by cab. 

## 2018-08-12 NOTE — [facility] Suicide Risk Assessment (Signed)
[facility] Discharge Suicide Risk Assessment   Principal Problem: Bipolar depression ([place]) Discharge Diagnoses: Principal Problem:   Bipolar depression ([place]) Active Problems:   Bipolar 1 disorder with moderate mania ([place])   Total Time spent with patient: 45 minutes  Musculoskeletal: Strength & Muscle Tone: within normal limits Gait & Station: normal Patient leans: N/A  Psychiatric Specialty Exam: Review of Systems  Constitutional: Negative.   HENT: Negative.   Eyes: Negative.   Respiratory: Negative.   Cardiovascular: Negative.   Gastrointestinal: Negative.   Musculoskeletal: Negative.   Skin: Negative.   Neurological: Negative.   Psychiatric/Behavioral: Negative.     Blood pressure 115/80, pulse 67, temperature 97.8 F (36.6 C), temperature source Oral, resp. rate 18, height 6' (1.829 m), weight 83.9 kg, SpO2 100 %.Body mass index is 25.09 kg/m.  General Appearance: Casual  Eye Contact::  Good  Speech:  Normal Rate409  Volume:  Normal  Mood:  Euthymic  Affect:  Congruent  Thought Process:  Goal Directed  Orientation:  Full (Time, Place, and Person)  Thought Content:  Logical  Suicidal Thoughts:  No  Homicidal Thoughts:  No  Memory:  Immediate;   Fair Recent;   Fair Remote;   Fair  Judgement:  Fair  Insight:  Fair  Psychomotor Activity:  Normal  Concentration:  Fair  Recall:  [employer] of Knowledge:Fair  Language: Fair  Akathisia:  No  Handed:  Right  AIMS (if indicated):     Assets:  Desire for Improvement Physical Health Resilience  Sleep:  Number of Hours: 8.15  Cognition: WNL  ADL's:  Intact   Mental Status Per Nursing Assessment::   On Admission:  NA  Demographic Factors:  Male, Low socioeconomic status and Unemployed  Loss Factors: Financial problems/change in socioeconomic status  Historical Factors: Impulsivity  Risk Reduction Factors:   Responsible for children under 20 years of age, Sense of responsibility to family, Positive social  support and Positive therapeutic relationship  Continued Clinical Symptoms:  Severe Anxiety and/or Agitation Depression:   Impulsivity  Cognitive Features That Contribute To Risk:  Thought constriction (tunnel vision)    Suicide Risk:  Minimal: No identifiable suicidal ideation.  Patients presenting with no risk factors but with morbid ruminations; may be classified as minimal risk based on the severity of the depressive symptoms  Follow-up Information    [facility] Follow up on 08/19/2018.   Why: Please follow up with [facility] on Friday, July, 24th at 9:30am. Please take your hospital discharge paperwork with you to your appointment.  Thank you. Contact information: [place] [address] [phone]           Plan Of Care/Follow-up recommendations:  Activity:  Activity as tolerated Diet:  Regular diet Other:  Patient has a prescription for his medication as well as a 10-day supply and has an appointment to follow-up with [facility] on Friday the 24th.  Patient is denying any suicidal ideation and he is calm and expressing positive plans for the future.  Alethia Berthold, MD 08/12/2018, 9:02 AM

## 2018-08-12 NOTE — Progress Notes (Signed)
Patient stated that he came to the ED with a pocket knife and it was locked up at the ED.Patient stated that he does not wait here to get that now.Patient states "I don't need that".

## 2018-08-12 NOTE — Progress Notes (Signed)
Patient slept through the night without distress, refused his medications  and asked not to be boarded I need my rest.

## 2018-08-12 NOTE — Progress Notes (Signed)
  [facility] Adult Case Management Discharge Plan :  Will you be returning to the same living situation after discharge:  No. Pt will be going to [place] At discharge, do you have transportation home?: Yes,  pt will be provided with taxi voucher Do you have the ability to pay for your medications: No.  Release of information consent forms completed and in the chart;  Patient's signature needed at discharge.  Patient to Follow up at: Follow-up Information    [place] Follow up on 08/19/2018.   Why: Please follow up with [facility] on Friday, July, 24th at 9:30am. Please take your hospital discharge paperwork with you to your appointment.  Thank you. Contact information: [place] [address] [phone]           Next level of care provider has access to [place] and Suicide Prevention discussed: Yes,  with pt; declined family contact  Have you used any form of tobacco in the last 30 days? (Cigarettes, Smokeless Tobacco, Cigars, and/or Pipes): Yes  Has patient been referred to the Quitline?: N/A patient is not a smoker  Patient has been referred for addiction treatment: N/A  Yvette Rack, LCSW 08/12/2018, 9:23 AM

## 2018-08-12 NOTE — Plan of Care (Signed)
Sleep with out any interruptions no distress.  Problem: Education: Goal: Ability to make informed decisions regarding treatment will improve Outcome: Progressing   Problem: Coping: Goal: Coping ability will improve Outcome: Progressing   Problem: Health Behavior/Discharge Planning: Goal: Identification of resources available to assist in meeting health care needs will improve Outcome: Progressing   Problem: Medication: Goal: Compliance with prescribed medication regimen will improve Outcome: Progressing   Problem: Self-Concept: Goal: Ability to disclose and discuss suicidal ideas will improve Outcome: Progressing Goal: Will verbalize positive feelings about self Outcome: Progressing   Problem: Education: Goal: Utilization of techniques to improve thought processes will improve Outcome: Progressing Goal: Knowledge of the prescribed therapeutic regimen will improve Outcome: Progressing   Problem: Activity: Goal: Interest or engagement in leisure activities will improve Outcome: Progressing Goal: Imbalance in normal sleep/wake cycle will improve Outcome: Progressing   Problem: Coping: Goal: Coping ability will improve Outcome: Progressing Goal: Will verbalize feelings Outcome: Progressing   Problem: Health Behavior/Discharge Planning: Goal: Ability to make decisions will improve Outcome: Progressing Goal: Compliance with therapeutic regimen will improve Outcome: Progressing   Problem: Role Relationship: Goal: Will demonstrate positive changes in social behaviors and relationships Outcome: Progressing   Problem: Safety: Goal: Ability to disclose and discuss suicidal ideas will improve Outcome: Progressing Goal: Ability to identify and utilize support systems that promote safety will improve Outcome: Progressing   Problem: Self-Concept: Goal: Will verbalize positive feelings about self Outcome: Progressing Goal: Level of anxiety will decrease Outcome:  Progressing   Problem: Education: Goal: Ability to state activities that reduce stress will improve Outcome: Progressing   Problem: Coping: Goal: Ability to identify and develop effective coping behavior will improve Outcome: Progressing   Problem: Self-Concept: Goal: Ability to identify factors that promote anxiety will improve Outcome: Progressing Goal: Level of anxiety will decrease Outcome: Progressing Goal: Ability to modify response to factors that promote anxiety will improve Outcome: Progressing   Problem: Education: Goal: Knowledge of disease or condition will improve Outcome: Progressing Goal: Understanding of discharge needs will improve Outcome: Progressing   Problem: Health Behavior/Discharge Planning: Goal: Ability to identify changes in lifestyle to reduce recurrence of condition will improve Outcome: Progressing Goal: Identification of resources available to assist in meeting health care needs will improve Outcome: Progressing   Problem: Physical Regulation: Goal: Complications related to the disease process, condition or treatment will be avoided or minimized Outcome: Progressing   Problem: Safety: Goal: Ability to remain free from injury will improve Outcome: Progressing

## 2018-08-12 NOTE — Discharge Summary (Signed)
Physician Discharge Summary Note  Patient:  Todd Ware is an 33 y.o., male MRN:  [id number] DOB:  01-30-1985 Patient phone:  [phone] (home)  Patient address:   [place] [address],  Total Time spent with patient: 45 minutes  Date of Admission:  08/10/2018 Date of Discharge: August 12, 2018  Reason for Admission: Admitted through the emergency room where he presented with agitation mood instability alleges statements about suicidal ideation.  Principal Problem: Bipolar depression [facility]) Discharge Diagnoses: Principal Problem:   Bipolar depression ([place]) Active Problems:   Bipolar 1 disorder with moderate mania ([place])   Past Psychiatric History: Describes a history of recurrent anxiety depression and irritability.  Denies past suicide attempts.  Denies active substance abuse.  Not currently enrolled with provider other than getting peers support through [facility]  Past Medical History: History reviewed. No pertinent past medical history.  Past Surgical History:  Procedure Laterality Date  . FACIAL FRACTURE SURGERY     Family History: History reviewed. No pertinent family history. Family Psychiatric  History: See previous Social History:  Social History   Substance and Sexual Activity  Alcohol Use No     Social History   Substance and Sexual Activity  Drug Use No    Social History   Socioeconomic History  . Marital status: Single    Spouse name: Not on file  . Number of children: Not on file  . Years of education: Not on file  . Highest education level: Not on file  Occupational History  . Not on file  Social Needs  . Financial resource strain: Not on file  . Food insecurity    Worry: Not on file    Inability: Not on file  . Transportation needs    Medical: Not on file    Non-medical: Not on file  Tobacco Use  . Smoking status: Current Every Day Smoker    Types: Cigarettes  . Smokeless tobacco: Never Used  Substance and Sexual Activity  . Alcohol  use: No  . Drug use: No  . Sexual activity: Not on file  Lifestyle  . Physical activity    Days per week: Not on file    Minutes per session: Not on file  . Stress: Not on file  Relationships  . Social Herbalist on phone: Not on file    Gets together: Not on file    Attends religious service: Not on file    Active member of club or organization: Not on file    Attends meetings of clubs or organizations: Not on file    Relationship status: Not on file  Other Topics Concern  . Not on file  Social History Narrative   ** Merged History Encounter [facility] Course: Patient admitted to the psychiatric ward.  15-minute checks employed.  Patient was not aggressive violent or suicidal on the unit.  Initially irritable and argumentative with his admission he was able eventually to calm down with some support and provide a clear history of his mood instability.  Patient was not expressing any symptoms of psychosis.  He was not actively suicidal.  He did express a willingness to engage in treatment.  After discussion with the patient it appeared that the differential diagnosis could include major depression with anxiety symptoms, possibly bipolar disorder but without psychotic mania or PTSD.  Patient was agreeable to starting Seroquel to aim for treating depression particularly if it were of a  bipolar type and also helping with his sleep and anxiety.  Tolerated medication.  This morning he is calm and appropriate continues to deny suicidal ideation.  He has an appointment to follow-up with [facility] on the 24th.  He is given a 10-day supply of medication and prescriptions at discharge.  Physical Findings: AIMS: Facial and Oral Movements Muscles of Facial Expression: None, normal Lips and Perioral Area: None, normal Jaw: None, normal Tongue: None, normal,Extremity Movements Upper (arms, wrists, hands, fingers): None, normal Lower (legs, knees, ankles, toes): None, normal, Trunk  Movements Neck, shoulders, hips: None, normal, Overall Severity Severity of abnormal movements (highest score from questions above): None, normal Incapacitation due to abnormal movements: None, normal Patient's awareness of abnormal movements (rate only patient's report): No Awareness, Dental Status Current problems with teeth and/or dentures?: No Does patient usually wear dentures?: No  CIWA:  CIWA-Ar Total: 7 COWS:  COWS Total Score: 3  Musculoskeletal: Strength & Muscle Tone: within normal limits Gait & Station: normal Patient leans: N/A  Psychiatric Specialty Exam: Physical Exam  Nursing note and vitals reviewed. Constitutional: He appears well-developed and well-nourished.  HENT:  Head: Normocephalic and atraumatic.  Eyes: Pupils are equal, round, and reactive to light. Conjunctivae are normal.  Neck: Normal range of motion.  Cardiovascular: Regular rhythm and normal heart sounds.  Respiratory: Effort normal. No respiratory distress.  GI: Soft.  Musculoskeletal: Normal range of motion.  Neurological: He is alert.  Skin: Skin is warm and dry.  Psychiatric: He has a normal mood and affect. His speech is normal and behavior is normal. Judgment and thought content normal. Cognition and memory are normal.    Review of Systems  Constitutional: Negative.   HENT: Negative.   Eyes: Negative.   Respiratory: Negative.   Cardiovascular: Negative.   Gastrointestinal: Negative.   Musculoskeletal: Negative.   Skin: Negative.   Neurological: Negative.   Psychiatric/Behavioral: Negative.     Blood pressure 115/80, pulse 67, temperature 97.8 F (36.6 C), temperature source Oral, resp. rate 18, height 6' (1.829 m), weight 83.9 kg, SpO2 100 %.Body mass index is 25.09 kg/m.  General Appearance: Casual  Eye Contact:  Good  Speech:  Clear and Coherent  Volume:  Normal  Mood:  Euthymic  Affect:  Constricted  Thought Process:  Goal Directed  Orientation:  Full (Time, Place, and  Person)  Thought Content:  Logical  Suicidal Thoughts:  No  Homicidal Thoughts:  No  Memory:  Immediate;   Fair Recent;   Fair Remote;   Fair  Judgement:  Fair  Insight:  Fair  Psychomotor Activity:  Normal  Concentration:  Concentration: Fair  Recall:  [employer]Fair  Fund of Knowledge:  Fair  Language:  Fair  Akathisia:  No  Handed:  Right  AIMS (if indicated):     Assets:  Desire for Improvement Physical Health Resilience  ADL's:  Intact  Cognition:  WNL  Sleep:  Number of Hours: 8.15     Have you used any form of tobacco in the last 30 days? (Cigarettes, Smokeless Tobacco, Cigars, and/or Pipes): Yes  Has this patient used any form of tobacco in the last 30 days? (Cigarettes, Smokeless Tobacco, Cigars, and/or Pipes) Yes, Yes, A prescription for an FDA-approved tobacco cessation medication was offered at discharge and the patient refused  Blood Alcohol level:  Lab Results  Component Value Date   ETH <10 08/10/2018    Metabolic Disorder Labs:  No results found for: HGBA1C, MPG No results found for: PROLACTIN No  results found for: CHOL, TRIG, HDL, CHOLHDL, VLDL, LDLCALC  See Psychiatric Specialty Exam and Suicide Risk Assessment completed by Attending Physician prior to discharge.  Discharge destination:  Home  Is patient on multiple antipsychotic therapies at discharge:  No   Has Patient had three or more failed trials of antipsychotic monotherapy by history:  No  Recommended Plan for Multiple Antipsychotic Therapies: NA  Discharge Instructions    Diet - low sodium heart healthy   Complete by: As directed    Increase activity slowly   Complete by: As directed      Allergies as of 08/12/2018   No Known Allergies     Medication List    STOP taking these medications   ibuprofen 600 MG tablet Commonly known as: ADVIL   ibuprofen 800 MG tablet Commonly known as: ADVIL     TAKE these medications     Indication  QUEtiapine 100 MG tablet Commonly known as:  SEROQUEL Take 1 tablet (100 mg total) by mouth at bedtime.  Indication: Depressive Phase of Manic-Depression, Generalized Anxiety Disorder      Follow-up Information    [facility] Follow up on 08/19/2018.   Why: Please follow up with [facility] on Friday, July, 24th at 9:30am. Please take your hospital discharge paperwork with you to your appointment.  Thank you. Contact information: [address] [place] [address] [phone]           Follow-up recommendations:  Activity:  Activity as tolerated Diet:  Regular diet Other:  Follow-up with [facility]  Comments: Patient was given a bit of psychoeducation regarding his symptoms and potential diagnoses and strongly encouraged to continue to seek treatment as what ever problems he is having certainly are things that can be treated.  Patient expresses agreement.  He will be going back to the shelter to stay in the short-term.  Signed: Mordecai RasmussenJohn Arshdeep Bolger, MD 08/12/2018, 9:06 AM

## 2023-05-22 ENCOUNTER — Emergency Department

## 2023-05-22 ENCOUNTER — Inpatient Hospital Stay
Admission: EM | Admit: 2023-05-22 | Discharge: 2023-05-25 | DRG: 444 | Disposition: A | Discharge status: Home / Self Care | Attending: Internal Medicine | Admitting: Internal Medicine

## 2023-05-22 ENCOUNTER — Observation Stay

## 2023-05-22 ENCOUNTER — Other Ambulatory Visit: Payer: Self-pay

## 2023-05-22 ENCOUNTER — Encounter: Payer: Self-pay | Admitting: *Deleted

## 2023-05-22 DIAGNOSIS — I2693 Single subsegmental pulmonary embolism without acute cor pulmonale: Secondary | ICD-10-CM | POA: Diagnosis present

## 2023-05-22 DIAGNOSIS — D72819 Decreased white blood cell count, unspecified: Secondary | ICD-10-CM | POA: Diagnosis present

## 2023-05-22 DIAGNOSIS — K8070 Calculus of gallbladder and bile duct without cholecystitis without obstruction: Principal | ICD-10-CM | POA: Diagnosis present

## 2023-05-22 DIAGNOSIS — I2699 Other pulmonary embolism without acute cor pulmonale: Principal | ICD-10-CM | POA: Diagnosis present

## 2023-05-22 DIAGNOSIS — F410 Panic disorder [episodic paroxysmal anxiety] without agoraphobia: Secondary | ICD-10-CM | POA: Diagnosis present

## 2023-05-22 DIAGNOSIS — F17210 Nicotine dependence, cigarettes, uncomplicated: Secondary | ICD-10-CM | POA: Diagnosis present

## 2023-05-22 DIAGNOSIS — F4310 Post-traumatic stress disorder, unspecified: Secondary | ICD-10-CM | POA: Diagnosis present

## 2023-05-22 DIAGNOSIS — F1910 Other psychoactive substance abuse, uncomplicated: Secondary | ICD-10-CM | POA: Diagnosis not present

## 2023-05-22 DIAGNOSIS — M7989 Other specified soft tissue disorders: Secondary | ICD-10-CM

## 2023-05-22 DIAGNOSIS — Z720 Tobacco use: Secondary | ICD-10-CM | POA: Insufficient documentation

## 2023-05-22 DIAGNOSIS — E669 Obesity, unspecified: Secondary | ICD-10-CM | POA: Diagnosis present

## 2023-05-22 DIAGNOSIS — K8050 Calculus of bile duct without cholangitis or cholecystitis without obstruction: Secondary | ICD-10-CM | POA: Insufficient documentation

## 2023-05-22 DIAGNOSIS — Z5901 Sheltered homelessness: Secondary | ICD-10-CM

## 2023-05-22 DIAGNOSIS — F17290 Nicotine dependence, other tobacco product, uncomplicated: Secondary | ICD-10-CM | POA: Diagnosis present

## 2023-05-22 DIAGNOSIS — F1110 Opioid abuse, uncomplicated: Secondary | ICD-10-CM | POA: Diagnosis present

## 2023-05-22 DIAGNOSIS — F319 Bipolar disorder, unspecified: Secondary | ICD-10-CM | POA: Diagnosis present

## 2023-05-22 DIAGNOSIS — J439 Emphysema, unspecified: Secondary | ICD-10-CM | POA: Diagnosis present

## 2023-05-22 DIAGNOSIS — Z652 Problems related to release from prison: Secondary | ICD-10-CM

## 2023-05-22 DIAGNOSIS — R519 Headache, unspecified: Secondary | ICD-10-CM

## 2023-05-22 DIAGNOSIS — Z62810 Personal history of physical and sexual abuse in childhood: Secondary | ICD-10-CM

## 2023-05-22 DIAGNOSIS — Z6833 Body mass index (BMI) 33.0-33.9, adult: Secondary | ICD-10-CM

## 2023-05-22 HISTORY — DX: Unspecified convulsions: R56.9

## 2023-05-22 LAB — BASIC METABOLIC PANEL WITH GFR
Anion gap: 10 (ref 5–15)
BUN: 11 mg/dL (ref 6–20)
CO2: 24 mmol/L (ref 22–32)
Calcium: 8.7 mg/dL — ABNORMAL LOW (ref 8.9–10.3)
Chloride: 103 mmol/L (ref 98–111)
Creatinine, Ser: 0.65 mg/dL (ref 0.61–1.24)
GFR, Estimated: >60 mL/min (ref >60)
Glucose, Bld: 87 mg/dL (ref 70–99)
Potassium: 3.6 mmol/L (ref 3.5–5.1)
Sodium: 137 mmol/L (ref 135–145)

## 2023-05-22 LAB — HEPATIC FUNCTION PANEL
ALT: 11 U/L (ref 0–44)
AST: 17 U/L (ref 15–41)
Albumin: 3.4 g/dL — ABNORMAL LOW (ref 3.5–5.0)
Alkaline Phosphatase: 58 U/L (ref 38–126)
Bilirubin, Direct: 0.1 mg/dL (ref 0.0–0.2)
Indirect Bilirubin: 0.7 mg/dL (ref 0.3–0.9)
Total Bilirubin: 0.8 mg/dL (ref 0.0–1.2)
Total Protein: 6.6 g/dL (ref 6.5–8.1)

## 2023-05-22 LAB — CBC WITH DIFFERENTIAL/PLATELET
Abs Immature Granulocytes: 0.01 10*3/uL (ref 0.00–0.07)
Basophils Absolute: 0 10*3/uL (ref 0.0–0.1)
Basophils Relative: 0 %
Eosinophils Absolute: 0 10*3/uL (ref 0.0–0.5)
Eosinophils Relative: 0 %
HCT: 42.6 % (ref 39.0–52.0)
Hemoglobin: 14.1 g/dL (ref 13.0–17.0)
Immature Granulocytes: 0 %
Lymphocytes Relative: 21 %
Lymphs Abs: 0.9 10*3/uL (ref 0.7–4.0)
MCH: 29 pg (ref 26.0–34.0)
MCHC: 33.1 g/dL (ref 30.0–36.0)
MCV: 87.7 fL (ref 80.0–100.0)
Monocytes Absolute: 0.4 10*3/uL (ref 0.1–1.0)
Monocytes Relative: 10 %
Neutro Abs: 2.9 10*3/uL (ref 1.7–7.7)
Neutrophils Relative %: 69 %
Platelets: 170 10*3/uL (ref 150–400)
RBC: 4.86 MIL/uL (ref 4.22–5.81)
RDW: 13.8 % (ref 11.5–15.5)
WBC: 4.3 10*3/uL (ref 4.0–10.5)
nRBC: 0 % (ref 0.0–0.2)

## 2023-05-22 LAB — APTT: aPTT: 32 s (ref 24–36)

## 2023-05-22 LAB — PROTIME-INR
INR: 1 (ref 0.8–1.2)
Prothrombin Time: 13.5 s (ref 11.4–15.2)

## 2023-05-22 LAB — TROPONIN I (HIGH SENSITIVITY)
Troponin I (High Sensitivity): 4 ng/L (ref <18)
Troponin I (High Sensitivity): 4 ng/L (ref <18)

## 2023-05-22 MED ORDER — KETOROLAC TROMETHAMINE 30 MG/ML IJ SOLN
15.0000 mg | Freq: Once | INTRAMUSCULAR | Status: AC
  Administered 2023-05-22: 15 mg via INTRAVENOUS
  Filled 2023-05-22: qty 1

## 2023-05-22 MED ORDER — LORAZEPAM 2 MG/ML IJ SOLN
1.0000 mg | Freq: Once | INTRAMUSCULAR | Status: AC
  Administered 2023-05-22: 1 mg via INTRAVENOUS
  Filled 2023-05-22: qty 1

## 2023-05-22 MED ORDER — NICOTINE 21 MG/24HR TD PT24
21.0000 mg | MEDICATED_PATCH | Freq: Every day | TRANSDERMAL | Status: DC
  Administered 2023-05-23 – 2023-05-25 (×2): 21 mg via TRANSDERMAL
  Filled 2023-05-22 (×2): qty 1

## 2023-05-22 MED ORDER — ONDANSETRON HCL 4 MG/2ML IJ SOLN
4.0000 mg | Freq: Once | INTRAMUSCULAR | Status: AC
  Administered 2023-05-22: 4 mg via INTRAVENOUS
  Filled 2023-05-22: qty 2

## 2023-05-22 MED ORDER — ORAL CARE MOUTH RINSE: 15.0000 mL | OROMUCOSAL | Status: DC | PRN

## 2023-05-22 MED ORDER — IOHEXOL 350 MG/ML SOLN
75.0000 mL | Freq: Once | INTRAVENOUS | Status: AC | PRN
  Administered 2023-05-22: 75 mL via INTRAVENOUS

## 2023-05-22 MED ORDER — ONDANSETRON HCL 4 MG/2ML IJ SOLN
4.0000 mg | Freq: Four times a day (QID) | INTRAMUSCULAR | Status: DC | PRN
  Administered 2023-05-23 – 2023-05-24 (×3): 4 mg via INTRAVENOUS
  Filled 2023-05-22 (×3): qty 2

## 2023-05-22 MED ORDER — MORPHINE SULFATE (PF) 4 MG/ML IV SOLN
4.0000 mg | Freq: Once | INTRAVENOUS | Status: AC
  Administered 2023-05-22: 4 mg via INTRAVENOUS
  Filled 2023-05-22: qty 1

## 2023-05-22 MED ORDER — HEPARIN BOLUS VIA INFUSION
5000.0000 [IU] | Freq: Once | INTRAVENOUS | Status: AC
  Administered 2023-05-22: 5000 [IU] via INTRAVENOUS
  Filled 2023-05-22: qty 5000

## 2023-05-22 MED ORDER — HEPARIN (PORCINE) 25000 UT/250ML-% IV SOLN
1700.0000 [IU]/h | INTRAVENOUS | Status: AC
  Administered 2023-05-22 – 2023-05-23 (×2): 1700 [IU]/h via INTRAVENOUS
  Filled 2023-05-22 (×3): qty 250

## 2023-05-22 MED ORDER — ACETAMINOPHEN 325 MG PO TABS
650.0000 mg | ORAL_TABLET | Freq: Four times a day (QID) | ORAL | Status: DC | PRN
  Administered 2023-05-23 (×2): 650 mg via ORAL
  Filled 2023-05-22 (×2): qty 2

## 2023-05-22 MED ORDER — ONDANSETRON HCL 4 MG PO TABS
4.0000 mg | ORAL_TABLET | Freq: Four times a day (QID) | ORAL | Status: DC | PRN
Start: 2023-05-22 — End: 2023-05-25
  Administered 2023-05-25: 4 mg via ORAL
  Filled 2023-05-22: qty 1

## 2023-05-22 MED ORDER — SODIUM CHLORIDE 0.9 % IV SOLN: INTRAVENOUS | Status: DC

## 2023-05-22 MED ORDER — GADOBUTROL 1 MMOL/ML IV SOLN
10.0000 mL | Freq: Once | INTRAVENOUS | Status: AC | PRN
  Administered 2023-05-22: 10 mL via INTRAVENOUS

## 2023-05-22 NOTE — Assessment & Plan Note (Signed)
 Acute onset of pleuritic chest pain and DOE x 1-2 days  CTA chest showing occlusive Posterior right upper lobe segmental to subsegmental pulmonary embolism LE u/s negative for DVT Trop negative x 2  Started on heparin gtt in the ER- will continue  Noted poor living conditions Will consult TOC to help with medication assistance

## 2023-05-22 NOTE — Plan of Care (Signed)

## 2023-05-22 NOTE — Assessment & Plan Note (Signed)
 Pt reports intermittent illicit drug use, though denies any recent use  Check UDS  Monitor

## 2023-05-22 NOTE — Assessment & Plan Note (Addendum)
 Noted choledocholithiasis on imaging without CBD dilatation  Minimal active abdominal pain  LFTs and T bili stable  Will check MRCP to correlate  Monitor  Consult GI as appropriate

## 2023-05-22 NOTE — ED Notes (Signed)
 Patient states his chest pain has improved and his breathing. Patient now c/o left foot pain, back pain and headache which he contributes to the "bumpy" ride to imaging. Patient also requested something to eat or drink. Patient informed that the doctor would have to be consulted first.

## 2023-05-22 NOTE — ED Provider Notes (Signed)
 [facility] Provider Note    Event Date/Time   First MD Initiated Contact with Patient 05/22/23 1205     (approximate)   History   Chief Complaint Chest Pain   HPI  Todd Ware is a 38 y.o. male with past medical history of bipolar disorder who presents to the ED complaining of chest pain.  Patient reports that he has had approximately 2 days of pain in the right side of his chest which she describes as sharp and radiating towards his back.  Pain is worse with certain movements but he denies any trauma to his chest or recent heavy lifting.  The pain does seem to be worse when he takes a deep breath, but he denies any fevers, cough, or shortness of breath.  He does state that he has had pain and swelling in his left leg for the past few days as well.  He has had this problem before, but states it is more severe than usual, denies any history of DVT/PE.  He also reports sudden onset of headache earlier this morning, denies vision changes, speech changes, numbness, or weakness.     Physical Exam   Triage Vital Signs: ED Triage Vitals  Encounter Vitals Group     BP      Systolic BP Percentile      Diastolic BP Percentile      Pulse      Resp      Temp      Temp src      SpO2      Weight      Height      Head Circumference      Peak Flow      Pain Score      Pain Loc      Pain Education      Exclude from Growth Chart     Most recent vital signs: Vitals:   05/22/23 1530 05/22/23 1600  BP: (!) 146/94 118/76  Pulse: (!) 54 (!) 43  Resp: (!) 23 15  Temp:    SpO2: 100% 97%    Constitutional: Alert and oriented. Eyes: Conjunctivae are normal. Head: Atraumatic. Neck: Supple with no meningismus. Nose: No congestion/rhinnorhea. Mouth/Throat: Mucous membranes are moist.  Cardiovascular: Normal rate, regular rhythm. Grossly normal heart sounds.  2+ radial and DP pulses bilaterally. Respiratory: Normal respiratory effort.  No retractions.  Lungs CTAB.  No chest wall tenderness to palpation noted. Gastrointestinal: Soft and nontender. No distention. Musculoskeletal: 1+ pitting edema to left knee with mild calf tenderness but no erythema or warmth, no right lower extremity edema or tenderness noted. Neurologic:  Normal speech and language. No gross focal neurologic deficits are appreciated.    ED Results / Procedures / Treatments   Labs (all labs ordered are listed, but only abnormal results are displayed) Labs Reviewed  BASIC METABOLIC PANEL WITH GFR - Abnormal; Notable for the following components:      Result Value   Calcium 8.7 (*)    All other components within normal limits  HEPATIC FUNCTION PANEL - Abnormal; Notable for the following components:   Albumin 3.4 (*)    All other components within normal limits  CBC WITH DIFFERENTIAL/PLATELET  APTT  PROTIME-INR  TROPONIN I (HIGH SENSITIVITY)  TROPONIN I (HIGH SENSITIVITY)     EKG  ED ECG REPORT I, Twilla Galea, the attending physician, personally viewed and interpreted this ECG.   Date: 05/22/2023  EKG Time: 12:33  Rate: 59  Rhythm: normal sinus rhythm  Axis: Normal  Intervals:none  ST&T Change: None  RADIOLOGY CT head reviewed and interpreted by me with no hemorrhage or midline shift.  PROCEDURES:  Critical Care performed: Yes, see critical care procedure note(s)  .Critical Care  Performed by: Twilla Galea, MD Authorized by: Twilla Galea, MD   Critical care provider statement:    Critical care time (minutes):  30   Critical care time was exclusive of:  Separately billable procedures and treating other patients and teaching time   Critical care was necessary to treat or prevent imminent or life-threatening deterioration of the following conditions: Pulmonary embolism.   Critical care was time spent personally by me on the following activities:  Development of treatment plan with patient or surrogate, discussions with consultants,  evaluation of patient's response to treatment, examination of patient, ordering and review of laboratory studies, ordering and review of radiographic studies, ordering and performing treatments and interventions, pulse oximetry, re-evaluation of patient's condition and review of old charts   I assumed direction of critical care for this patient from another provider in my specialty: no     Care discussed with: admitting provider      MEDICATIONS ORDERED IN ED: Medications  heparin bolus via infusion 5,000 Units (has no administration in time range)  heparin ADULT infusion 100 units/mL (25000 units/250mL) (has no administration in time range)  morphine (PF) 4 MG/ML injection 4 mg (4 mg Intravenous Given 05/22/23 1347)  ondansetron (ZOFRAN) injection 4 mg (4 mg Intravenous Given 05/22/23 1347)  iohexol (OMNIPAQUE) 350 MG/ML injection 75 mL (75 mLs Intravenous Contrast Given 05/22/23 1506)  ketorolac (TORADOL) 30 MG/ML injection 15 mg (15 mg Intravenous Given 05/22/23 1543)     IMPRESSION / MDM / ASSESSMENT AND PLAN / ED COURSE  I reviewed the triage vital signs and the nursing notes.                              38 y.o. male with past medical history of bipolar disorder who presents to the ED with 2 days of sharp pain in the right side of his chest that is worse with a deep breath, additionally complains of pain and swelling in his left leg and sudden onset headache.  Patient's presentation is most consistent with acute presentation with potential threat to life or bodily function.  Differential diagnosis includes, but is not limited to, ACS, PE, pneumonia, pneumothorax, musculoskeletal pain, GERD, anxiety, DVT, SAH, migraine headache, tension headache.  Patient nontoxic-appearing and in no acute distress, vital signs are unremarkable.  EKG shows no dysrhythmia or ischemia, chest pain is not reproducible with palpation.  Leg swelling and pleuritic chest pain, will further assess with CTA of his  chest to rule out PE as well as ultrasound of his left lower extremity.  He is neurovascular intact to his left lower extremity with no evidence of infectious process.  Also plan to check CT head given acute onset of headache, but he has a nonfocal neurologic exam.  Lab results are pending at this time, patient declines pain medication.  CT head is negative for acute process, CTA of chest shows right-sided pulmonary embolism with associated pulmonary infarct.  Troponin within normal limits and no findings concerning for right heart strain.  Left lower extremity ultrasound is unremarkable, patient started on heparin drip and case discussed with hospitalist for admission.      FINAL CLINICAL IMPRESSION(S) / ED DIAGNOSES  Final diagnoses:  Acute pulmonary embolism without acute cor pulmonale, unspecified pulmonary embolism type (HCC)  Acute nonintractable headache, unspecified headache type  Left leg swelling     Rx / DC Orders   ED Discharge Orders     None        Note:  This document was prepared using Dragon voice recognition software and may include unintentional dictation errors.   Twilla Galea, MD 05/22/23 [phone]

## 2023-05-22 NOTE — ED Notes (Signed)
 Patient taken to MRI

## 2023-05-22 NOTE — ED Notes (Signed)
Report given to Jacob RN

## 2023-05-22 NOTE — ED Triage Notes (Addendum)
 EMS report given to Dr. Cleora Daft. Patient c/o right-side chest pain that radiates to back. Patient c/o left leg pain from the knee down and is swollen since last week.  Patient states chest pain worsens with deep breath and movement, lifting right arm increases the pain.

## 2023-05-22 NOTE — ED Notes (Signed)
 This writer was in MRI with patient after Ativan  administration. MRI Tech also checked on patient during exam and he was tolerating it well, opening eyes and acknowledging their presence.

## 2023-05-22 NOTE — Progress Notes (Addendum)
 PHARMACY - ANTICOAGULATION CONSULT NOTE  Pharmacy Consult for heparin drip Indication: pulmonary embolus  No Known Allergies  Patient Measurements: Height: 6' (182.9 cm) Weight: 113.4 kg (250 lb) IBW/kg (Calculated) : 77.6 HEPARIN DW (KG): 101.9  Vital Signs: Temp: 98.1 F (36.7 C) (04/26 1212) Temp Source: Oral (04/26 1212) BP: 118/76 (04/26 1600) Pulse Rate: 43 (04/26 1600)  Labs: Recent Labs    05/22/23 1251 05/22/23 1350  HGB 14.1  --   HCT 42.6  --   PLT 170  --   CREATININE  --  0.65  TROPONINIHS 4 4    Estimated Creatinine Clearance: 167.5 mL/min (by C-G formula based on SCr of 0.65 mg/dL).   Medical History: Past Medical History:  Diagnosis Date   Seizures (HCC)     Medications:  (Not in a hospital admission)  Scheduled:   heparin  5,000 Units Intravenous Once   Infusions:   heparin      Assessment: 38 yo male to start heparin drip for PE. Hx: bipolar No anticoagulants PTA Baseline :  Hgb 14.1  Plt 170   aPTT 32   INR 1.0  Goal of Therapy:  Heparin level 0.3-0.7 units/ml Monitor platelets by anticoagulation protocol: Yes   Plan:  Give 5000 units bolus x 1 Start heparin infusion at 1700 units/hr Check anti-Xa level in 6 hours and daily while on heparin Continue to monitor H&H and platelets  Thomasine Flick PharmD Clinical Pharmacist 05/22/2023

## 2023-05-22 NOTE — Assessment & Plan Note (Signed)
 Heavy baseline tobacco abuse including cigarettes and black and mild Discussed cessation at length  Nicotine patch

## 2023-05-22 NOTE — ED Notes (Signed)
 Ultrasound tech at bedside. Will check leads connection when tech is finished.

## 2023-05-22 NOTE — H&P (Signed)
 History and Physical    Patient: Todd Ware:[id number] DOB: [birth year] DOA: 05/22/2023 DOS: the patient was seen and examined on 05/22/2023 PCP: Patient, No Pcp Per  Patient coming from: Home  Chief Complaint:  Chief Complaint  Patient presents with   Chest Pain   HPI: Todd Ware is a 38 y.o. male with medical history significant of obesity, tobacco abuse, polysubstance abuse presenting with PE, choledocholithiasis.  Patient reports recurrent intermittent moderate to severe central chest pain since yesterday.  Chest pain worse with movement and deep breathing.  Patient denies any prior episodes like this in the past.  No fevers or chills.  Minimal cough.  No abdominal pain.  No nausea or vomiting.  Baseline heavy tobacco use.  Smokes several cigarettes as well as black and milds on a regular basis.  Denies any alcohol use.  Does admit to some illicit drug use at times.  Recently released from prison within the past 2 months.  Is currently staying at different hotels and friends houses in the area.  Denies any known family history of blood clots in the past.  No recent extended trips.  Has had some intermittent lower extremity swelling.  Minimal orthopnea or PND.  Chest pain became more severe over the course of the day and morning. Presented to the ER afebrile, heart rate 60s to 40s.  BP stable.  Satting well on room air.  White count 4.3, hemoglobin 14.1, platelets 170, creatinine 0.65.  Troponin negative x 2.  LFTs within normal limits.  Lower extremity ultrasound negative for DVT.  CT head within normal limits.  CT angio of the chest showing occlusive posterior right upper lobe pulmonary embolism.  Also with cholelithiasis as well as choledocholithiasis without significant biliary ductal dilatation.  Positive emphysema. Review of Systems: As mentioned in the history of present illness. All other systems reviewed and are negative. Past Medical History:  Diagnosis Date   Seizures  [facility])    Past Surgical History:  Procedure Laterality Date   FACIAL FRACTURE SURGERY     Social History:  reports that he has been smoking cigarettes. He has never used smokeless tobacco. He reports current drug use. Drug: Marijuana. He reports that he does not drink alcohol.  No Known Allergies  History reviewed. No pertinent family history.  Prior to Admission medications   Not on File    Physical Exam: Vitals:   05/22/23 1530 05/22/23 1600 05/22/23 1630 05/22/23 1704  BP: (!) 146/94 118/76 128/84   Pulse: (!) 54 (!) 43 (!) 43   Resp: (!) 23 15 13    Temp:    98.2 F (36.8 C)  TempSrc:    Oral  SpO2: 100% 97% 100%   Weight:      Height:       Physical Exam Constitutional:      Appearance: He is obese.  HENT:     Head: Normocephalic and atraumatic.     Nose: Nose normal.     Mouth/Throat:     Mouth: Mucous membranes are moist.  Eyes:     Pupils: Pupils are equal, round, and reactive to light.  Cardiovascular:     Rate and Rhythm: Normal rate and regular rhythm.  Pulmonary:     Effort: Pulmonary effort is normal.  Abdominal:     General: Bowel sounds are normal.  Musculoskeletal:        General: Normal range of motion.  Skin:    General: Skin is warm.  Neurological:  General: No focal deficit present.  Psychiatric:        Mood and Affect: Mood normal.     Data Reviewed:  There are no new results to review at this time.  CT Head Wo Contrast CLINICAL DATA:  Headache sudden, severe.  EXAM: CT HEAD WITHOUT CONTRAST  TECHNIQUE: Contiguous axial images were obtained from the base of the skull through the vertex without intravenous contrast.  RADIATION DOSE REDUCTION: This exam was performed according to the departmental dose-optimization program which includes automated exposure control, adjustment of the mA and/or kV according to patient size and/or use of iterative reconstruction technique.  COMPARISON:  None Available.  FINDINGS: Brain: No  acute infarct, hemorrhage, or mass lesion is present. No significant white matter lesions are present. Deep brain nuclei are within normal limits. The ventricles are of normal size. No significant extraaxial fluid collection is present.  The brainstem and cerebellum are within normal limits. Midline structures are within normal limits.  Vascular: No hyperdense vessel or unexpected calcification.  Skull: Calvarium is intact. No focal lytic or blastic lesions are present. Left supraorbital frontal scalp soft tissue swelling is present. No underlying fracture or foreign body is present. No other significant extracranial soft tissue injury is present.  Sinuses/Orbits: A small polyp or mucous retention cyst is present posteriorly in the right sphenoid sinus. The paranasal sinuses and mastoid air cells are otherwise clear. The globes and orbits are within normal limits.  IMPRESSION: 1. Left supraorbital frontal scalp soft tissue swelling without underlying fracture or foreign body. 2. Normal CT appearance of the brain. 3. Small polyp or mucous retention cyst posteriorly in the right sphenoid sinus.  Electronically Signed   By: Audree Leas M.D.   On: 05/22/2023 16:33 US  Venous Img Lower Unilateral Left CLINICAL DATA:  Left lower extremity pain and swelling.  EXAM: Left LOWER EXTREMITY VENOUS DOPPLER ULTRASOUND  TECHNIQUE: Gray-scale sonography with compression, as well as color and duplex ultrasound, were performed to evaluate the deep venous system(s) from the level of the common femoral vein through the popliteal and proximal calf veins.  COMPARISON:  None Available.  FINDINGS: VENOUS  Normal compressibility of the common femoral, superficial femoral, and popliteal veins, as well as the visualized calf veins. Visualized portions of profunda femoral vein and great saphenous vein unremarkable. No filling defects to suggest DVT on grayscale or color Doppler  imaging. Doppler waveforms show normal direction of venous flow, normal respiratory plasticity and response to augmentation.  Limited views of the contralateral common femoral vein are unremarkable.  OTHER  None.  Limitations: none  IMPRESSION: Negative left lower extremity venous Doppler ultrasound.  No DVT.  Electronically Signed   By: Audree Leas M.D.   On: 05/22/2023 16:28 CT Angio Chest PE W/Cm &/Or Wo Cm CLINICAL DATA:  Right-sided chest pain.  Left-sided leg pain.  EXAM: CT ANGIOGRAPHY CHEST WITH CONTRAST  TECHNIQUE: Multidetector CT imaging of the chest was performed using the standard protocol during bolus administration of intravenous contrast. Multiplanar CT image reconstructions and MIPs were obtained to evaluate the vascular anatomy.  RADIATION DOSE REDUCTION: This exam was performed according to the departmental dose-optimization program which includes automated exposure control, adjustment of the mA and/or kV according to patient size and/or use of iterative reconstruction technique.  CONTRAST:  75mL OMNIPAQUE IOHEXOL 350 MG/ML SOLN  COMPARISON:  09/01/2016 chest radiograph from . No comparison CT.  FINDINGS: Cardiovascular: The quality of this exam for evaluation of pulmonary embolism is sufficient. Isolated  filling defect within the posterior right upper lobe segmental and subsegmental pulmonary artery branches, relatively occlusive including on 136/9 and coronal image 78.  Normal aortic caliber. Mild cardiomegaly, without pericardial effusion.  Mediastinum/Nodes: Borderline to mild right hilar adenopathy is likely reactive at 1.4 cm. No mediastinal or left hilar adenopathy.  Lungs/Pleura: Trace right pleural fluid. Mild paraseptal and centrilobular emphysema.  Posterior right upper lobe pleural-based airspace opacity including on 34/8.  3 mm pleural-based left upper lobe pulmonary nodule on 31/8.  Upper Abdomen:  Multiple small gallstones. Focal steatosis adjacent the falciform ligament. Normal imaged portions of the spleen, stomach, pancreas, adrenal glands, kidneys  Although there is no significant common duct dilatation, choledocholithiasis is seen including stones of up to 5 mm on 146/7 and coronal image 63.  Musculoskeletal: No acute osseous abnormality. Minimal convex left thoracic spine curvature.  Review of the MIP images confirms the above findings.  IMPRESSION: 1. Posterior right upper lobe segmental to subsegmental pulmonary embolism, likely occlusive. Posterior right upper lobe infarct. 2. Trace right pleural fluid 3. Cholelithiasis. Choledocholithiasis without significant biliary duct dilatation. 4. Emphysema (ICD10-J43.9). left upper lobe 3 mm pulmonary nodule. No follow-up needed if patient is low-risk. Non-contrast chest CT can be considered in 12 months if patient is high-risk, nodule is upper lobe, and/or suspicious in morphology. This recommendation follows the consensus statement: Guidelines for Management of Incidental Pulmonary Nodules Detected on CT Images: From the Fleischner Society 2017; Radiology 2017; 284:228-243.  Critical test results telephoned to . Dr. Cleora Daft. At the time of interpretation at . 3:45 p.m.On . 05/22/2023.  Electronically Signed   By: Lore Rode M.D.   On: 05/22/2023 15:44  Lab Results  Component Value Date   WBC 4.3 05/22/2023   HGB 14.1 05/22/2023   HCT 42.6 05/22/2023   MCV 87.7 05/22/2023   PLT 170 05/22/2023   Last metabolic panel Lab Results  Component Value Date   GLUCOSE 87 05/22/2023   NA 137 05/22/2023   K 3.6 05/22/2023   CL 103 05/22/2023   CO2 24 05/22/2023   BUN 11 05/22/2023   CREATININE 0.65 05/22/2023   GFRNONAA >60 05/22/2023   CALCIUM 8.7 (L) 05/22/2023   PROT 6.6 05/22/2023   ALBUMIN 3.4 (L) 05/22/2023   BILITOT 0.8 05/22/2023   ALKPHOS 58 05/22/2023   AST 17 05/22/2023   ALT 11 05/22/2023    ANIONGAP 10 05/22/2023    Assessment and Plan: * Pulmonary embolism (HCC) Acute onset of pleuritic chest pain and DOE x 1-2 days  CTA chest showing occlusive Posterior right upper lobe segmental to subsegmental pulmonary embolism LE u/s negative for DVT Trop negative x 2  Started on heparin gtt in the ER- will continue  Noted poor living conditions Will consult TOC to help with medication assistance   Polysubstance abuse (HCC) Pt reports intermittent illicit drug use, though denies any recent use  Check UDS  Monitor    Tobacco abuse Heavy baseline tobacco abuse including cigarettes and black and mild Discussed cessation at length  Nicotine patch    Choledocholithiasis Noted choledocholithiasis on imaging without CBD dilatation  Minimal active abdominal pain  LFTs and T bili stable  Will check MRCP to correlate  Monitor  Consult GI as appropriate        Advance Care Planning:   Code Status: Full Code   Consults: None   Family Communication: No family   Severity of Illness: The appropriate patient status for this patient is OBSERVATION. Observation status is  judged to be reasonable and necessary in order to provide the required intensity of service to ensure the patient's safety. The patient's presenting symptoms, physical exam findings, and initial radiographic and laboratory data in the context of their medical condition is felt to place them at decreased risk for further clinical deterioration. Furthermore, it is anticipated that the patient will be medically stable for discharge from the hospital within 2 midnights of admission.   Author: Corrinne Din, MD 05/22/2023 5:33 PM  For on call review [email or web address].

## 2023-05-23 DIAGNOSIS — F17210 Nicotine dependence, cigarettes, uncomplicated: Secondary | ICD-10-CM | POA: Diagnosis present

## 2023-05-23 DIAGNOSIS — J439 Emphysema, unspecified: Secondary | ICD-10-CM | POA: Diagnosis present

## 2023-05-23 DIAGNOSIS — F17290 Nicotine dependence, other tobacco product, uncomplicated: Secondary | ICD-10-CM | POA: Diagnosis present

## 2023-05-23 DIAGNOSIS — Z5901 Sheltered homelessness: Secondary | ICD-10-CM | POA: Diagnosis not present

## 2023-05-23 DIAGNOSIS — Z6833 Body mass index (BMI) 33.0-33.9, adult: Secondary | ICD-10-CM | POA: Diagnosis not present

## 2023-05-23 DIAGNOSIS — D72819 Decreased white blood cell count, unspecified: Secondary | ICD-10-CM | POA: Diagnosis present

## 2023-05-23 DIAGNOSIS — F4310 Post-traumatic stress disorder, unspecified: Secondary | ICD-10-CM | POA: Diagnosis present

## 2023-05-23 DIAGNOSIS — I2693 Single subsegmental pulmonary embolism without acute cor pulmonale: Secondary | ICD-10-CM | POA: Diagnosis present

## 2023-05-23 DIAGNOSIS — F410 Panic disorder [episodic paroxysmal anxiety] without agoraphobia: Secondary | ICD-10-CM | POA: Diagnosis present

## 2023-05-23 DIAGNOSIS — E669 Obesity, unspecified: Secondary | ICD-10-CM | POA: Diagnosis present

## 2023-05-23 DIAGNOSIS — K8070 Calculus of gallbladder and bile duct without cholecystitis without obstruction: Secondary | ICD-10-CM | POA: Diagnosis present

## 2023-05-23 DIAGNOSIS — F1110 Opioid abuse, uncomplicated: Secondary | ICD-10-CM | POA: Diagnosis present

## 2023-05-23 DIAGNOSIS — I2699 Other pulmonary embolism without acute cor pulmonale: Secondary | ICD-10-CM | POA: Diagnosis present

## 2023-05-23 DIAGNOSIS — Z62810 Personal history of physical and sexual abuse in childhood: Secondary | ICD-10-CM | POA: Diagnosis not present

## 2023-05-23 DIAGNOSIS — F319 Bipolar disorder, unspecified: Secondary | ICD-10-CM | POA: Diagnosis present

## 2023-05-23 DIAGNOSIS — K8050 Calculus of bile duct without cholangitis or cholecystitis without obstruction: Secondary | ICD-10-CM | POA: Diagnosis not present

## 2023-05-23 DIAGNOSIS — Z652 Problems related to release from prison: Secondary | ICD-10-CM | POA: Diagnosis not present

## 2023-05-23 LAB — COMPREHENSIVE METABOLIC PANEL WITH GFR
ALT: 10 U/L (ref 0–44)
AST: 13 U/L — ABNORMAL LOW (ref 15–41)
Albumin: 3.1 g/dL — ABNORMAL LOW (ref 3.5–5.0)
Alkaline Phosphatase: 56 U/L (ref 38–126)
Anion gap: 3 — ABNORMAL LOW (ref 5–15)
BUN: 10 mg/dL (ref 6–20)
CO2: 28 mmol/L (ref 22–32)
Calcium: 8.1 mg/dL — ABNORMAL LOW (ref 8.9–10.3)
Chloride: 105 mmol/L (ref 98–111)
Creatinine, Ser: 0.74 mg/dL (ref 0.61–1.24)
GFR, Estimated: >60 mL/min (ref >60)
Glucose, Bld: 89 mg/dL (ref 70–99)
Potassium: 3.8 mmol/L (ref 3.5–5.1)
Sodium: 136 mmol/L (ref 135–145)
Total Bilirubin: 0.5 mg/dL (ref 0.0–1.2)
Total Protein: 6.2 g/dL — ABNORMAL LOW (ref 6.5–8.1)

## 2023-05-23 LAB — URINE DRUG SCREEN, QUALITATIVE (ARMC ONLY)
Amphetamines, Ur Screen: NOT DETECTED
Barbiturates, Ur Screen: NOT DETECTED
Benzodiazepine, Ur Scrn: NOT DETECTED
Cannabinoid 50 Ng, Ur ~~LOC~~: POSITIVE — AB
Cocaine Metabolite,Ur ~~LOC~~: NOT DETECTED
MDMA (Ecstasy)Ur Screen: NOT DETECTED
Methadone Scn, Ur: NOT DETECTED
Opiate, Ur Screen: POSITIVE — AB
Phencyclidine (PCP) Ur S: NOT DETECTED
Tricyclic, Ur Screen: NOT DETECTED

## 2023-05-23 LAB — CBC
HCT: 40 % (ref 39.0–52.0)
Hemoglobin: 13.4 g/dL (ref 13.0–17.0)
MCH: 29.2 pg (ref 26.0–34.0)
MCHC: 33.5 g/dL (ref 30.0–36.0)
MCV: 87.1 fL (ref 80.0–100.0)
Platelets: 168 10*3/uL (ref 150–400)
RBC: 4.59 MIL/uL (ref 4.22–5.81)
RDW: 13.7 % (ref 11.5–15.5)
WBC: 3.9 10*3/uL — ABNORMAL LOW (ref 4.0–10.5)
nRBC: 0 % (ref 0.0–0.2)

## 2023-05-23 LAB — HIV ANTIBODY (ROUTINE TESTING W REFLEX): HIV Screen 4th Generation wRfx: NONREACTIVE

## 2023-05-23 LAB — HEPARIN LEVEL (UNFRACTIONATED)
Heparin Unfractionated: 0.49 [IU]/mL (ref 0.30–0.70)
Heparin Unfractionated: 0.54 [IU]/mL (ref 0.30–0.70)

## 2023-05-23 MED ORDER — OXYCODONE HCL 5 MG PO TABS
5.0000 mg | ORAL_TABLET | Freq: Four times a day (QID) | ORAL | Status: DC | PRN
  Administered 2023-05-23 – 2023-05-25 (×6): 5 mg via ORAL
  Filled 2023-05-23 (×6): qty 1

## 2023-05-23 MED ORDER — KETOROLAC TROMETHAMINE 30 MG/ML IJ SOLN
15.0000 mg | Freq: Once | INTRAMUSCULAR | Status: AC
  Administered 2023-05-23: 15 mg via INTRAVENOUS
  Filled 2023-05-23: qty 1

## 2023-05-23 MED ORDER — MORPHINE SULFATE (PF) 2 MG/ML IV SOLN
2.0000 mg | INTRAVENOUS | Status: AC | PRN
  Administered 2023-05-23 – 2023-05-24 (×3): 2 mg via INTRAVENOUS
  Filled 2023-05-23 (×3): qty 1

## 2023-05-23 NOTE — Consult Note (Signed)
 Consultation  Referring Provider:  Hospitalist    Admit date: 05/22/2023 Consult date: 05/23/2023         Reason for Consultation: Choledocholithiasis              HPI:   Todd Ware is a 38 y.o. gentleman with history of seizures who presents with pleuritic chest pain and found to have segmental/subsegmental PE. He was incidentally also found to have choledocholithiasis and cholelithiasis. Liver enzymes are normal. No history of abdominal surgeries. History of marijuana use. No other significant symptoms.   Past Medical History:  Diagnosis Date   Seizures [facility])     Past Surgical History:  Procedure Laterality Date   FACIAL FRACTURE SURGERY      History reviewed. No pertinent family history. No family history of GI malignancies.  Social History   Tobacco Use   Smoking status: Every Day    Types: Cigarettes   Smokeless tobacco: Never  Substance Use Topics   Alcohol use: No   Drug use: Yes    Types: Marijuana    Comment: molly last week    Prior to Admission medications   Not on File    Current Facility-Administered Medications  Medication Dose Route Frequency Provider Last Rate Last Admin   acetaminophen  (TYLENOL ) tablet 650 mg  650 mg Oral Q6H PRN Newton, Steven J, MD   650 mg at 05/23/23 0614   heparin ADULT infusion 100 units/mL (25000 units/250mL)  1,700 Units/hr Intravenous Continuous Scotty Cyphers, RPH 17 mL/hr at 05/23/23 0617 1,700 Units/hr at 05/23/23 0617   morphine (PF) 2 MG/ML injection 2 mg  2 mg Intravenous Q4H PRN Sheril Dines, MD       nicotine (NICODERM CQ - dosed in mg/24 hours) patch 21 mg  21 mg Transdermal Daily Corrinne Din, MD   21 mg at 05/23/23 0832   ondansetron (ZOFRAN) tablet 4 mg  4 mg Oral Q6H PRN Newton, Steven J, MD       Or   ondansetron (ZOFRAN) injection 4 mg  4 mg Intravenous Q6H PRN Newton, Steven J, MD   4 mg at 05/23/23 0941   Oral care mouth rinse  15 mL Mouth Rinse PRN Corrinne Din, MD       oxyCODONE  (Oxy  IR/ROXICODONE ) immediate release tablet 5 mg  5 mg Oral Q6H PRN Sheril Dines, MD   5 mg at 05/23/23 0830    Allergies as of 05/22/2023   (No Known Allergies)     Review of Systems:    All systems reviewed and negative except where noted in HPI.  Review of Systems  Constitutional:  Negative for chills and fever.  Respiratory:  Negative for shortness of breath.   Cardiovascular:  Positive for chest pain.  Gastrointestinal:  Negative for abdominal pain, blood in stool and melena.  Musculoskeletal:  Negative for joint pain.  Skin:  Negative for rash.  Neurological:  Negative for focal weakness.  Psychiatric/Behavioral:  Negative for substance abuse.   All other systems reviewed and are negative.      Physical Exam:  Vital signs in last 24 hours: Temp:  [97.8 F (36.6 C)-98.2 F (36.8 C)] 98 F (36.7 C) (04/27 0721) Pulse Rate:  [43-65] 53 (04/27 0721) Resp:  [13-23] 16 (04/27 0721) BP: (118-150)/(76-102) 135/94 (04/27 0721) SpO2:  [97 %-100 %] 100 % (04/27 0412) Weight:  [113.4 kg] 113.4 kg (04/26 1214) Last BM Date : 05/22/23 General:   Pleasant in NAD Head:  Normocephalic  and atraumatic. Eyes:   No icterus.   Conjunctiva pink. Ears:  Normal auditory acuity. Mouth: Mucosa pink moist, no lesions. Neck:  Supple; no masses felt Lungs: No respiratory distress  Abdomen:   Flat, soft, nondistended, nontender Rectal:  Not performed.  Msk:  Strength 5/5. Symmetrical without gross deformities. Neurologic:  Alert and  oriented x4;  No focal deficits Skin:  Warm, dry, pink without significant lesions or rashes. Psych:  Alert and cooperative. Normal affect.  LAB RESULTS: Recent Labs    05/22/23 1251 05/23/23 0601  WBC 4.3 3.9*  HGB 14.1 13.4  HCT 42.6 40.0  PLT 170 168   BMET Recent Labs    05/22/23 1350 05/23/23 0601  NA 137 136  K 3.6 3.8  CL 103 105  CO2 24 28  GLUCOSE 87 89  BUN 11 10  CREATININE 0.65 0.74  CALCIUM 8.7* 8.1*   LFT Recent Labs     05/22/23 1350 05/23/23 0601  PROT 6.6 6.2*  ALBUMIN 3.4* 3.1*  AST 17 13*  ALT 11 10  ALKPHOS 58 56  BILITOT 0.8 0.5  BILIDIR 0.1  --   IBILI 0.7  --    PT/INR Recent Labs    05/22/23 1653  LABPROT 13.5  INR 1.0    STUDIES: MR ABDOMEN MRCP W WO CONTAST Result Date: 05/23/2023 CLINICAL DATA:  Right upper quadrant abdominal pain. Evaluate for choledocholithiasis. EXAM: MRI ABDOMEN WITHOUT AND WITH CONTRAST (INCLUDING MRCP) TECHNIQUE: Multiplanar multisequence MR imaging of the abdomen was performed both before and after the administration of intravenous contrast. Heavily T2-weighted images of the biliary and pancreatic ducts were obtained, and three-dimensional MRCP images were rendered by post processing. CONTRAST:  10mL GADAVIST GADOBUTROL 1 MMOL/ML IV SOLN COMPARISON:  None Available. FINDINGS: Lower chest: No acute findings. Hepatobiliary: No focal liver abnormality. Multiple stones identified within the gallbladder. The largest stone is in the neck measuring 1.1 cm, image 17/9. Gallbladder appears decompressed. No significant gallbladder wall thickening or pericholecystic inflammation. Multiple stones identified within the mid and distal common bile duct which measure up to 9 mm. Mild fusiform dilatation of the common bile duct measures up to 8 mm at the level of the mid duct. Mild central biliary dilatation. Pancreas: No mass, inflammatory changes, or other parenchymal abnormality identified. Spleen:  Within normal limits in size and appearance. Adrenals/Urinary Tract: No masses identified. No evidence of hydronephrosis. Stomach/Bowel: The stomach appears normal. There is no dilated loops of large or small bowel. Vascular/Lymphatic: No pathologically enlarged lymph nodes identified. No abdominal aortic aneurysm demonstrated. Other:  No ascites or focal fluid collections. Musculoskeletal: No suspicious bone lesions identified. IMPRESSION: 1. Cholelithiasis without secondary signs of acute  cholecystitis. 2. Choledocholithiasis with mild fusiform dilatation of the common bile duct and mild central biliary dilatation. The mid common bile duct measures up to 8 mm. Electronically Signed   By: Kimberley Penman M.D.   On: 05/23/2023 03:57   MR 3D Recon At Scanner Result Date: 05/23/2023 CLINICAL DATA:  Right upper quadrant abdominal pain. Evaluate for choledocholithiasis. EXAM: MRI ABDOMEN WITHOUT AND WITH CONTRAST (INCLUDING MRCP) TECHNIQUE: Multiplanar multisequence MR imaging of the abdomen was performed both before and after the administration of intravenous contrast. Heavily T2-weighted images of the biliary and pancreatic ducts were obtained, and three-dimensional MRCP images were rendered by post processing. CONTRAST:  10mL GADAVIST GADOBUTROL 1 MMOL/ML IV SOLN COMPARISON:  None Available. FINDINGS: Lower chest: No acute findings. Hepatobiliary: No focal liver abnormality. Multiple stones identified within  the gallbladder. The largest stone is in the neck measuring 1.1 cm, image 17/9. Gallbladder appears decompressed. No significant gallbladder wall thickening or pericholecystic inflammation. Multiple stones identified within the mid and distal common bile duct which measure up to 9 mm. Mild fusiform dilatation of the common bile duct measures up to 8 mm at the level of the mid duct. Mild central biliary dilatation. Pancreas: No mass, inflammatory changes, or other parenchymal abnormality identified. Spleen:  Within normal limits in size and appearance. Adrenals/Urinary Tract: No masses identified. No evidence of hydronephrosis. Stomach/Bowel: The stomach appears normal. There is no dilated loops of large or small bowel. Vascular/Lymphatic: No pathologically enlarged lymph nodes identified. No abdominal aortic aneurysm demonstrated. Other:  No ascites or focal fluid collections. Musculoskeletal: No suspicious bone lesions identified. IMPRESSION: 1. Cholelithiasis without secondary signs of acute  cholecystitis. 2. Choledocholithiasis with mild fusiform dilatation of the common bile duct and mild central biliary dilatation. The mid common bile duct measures up to 8 mm. Electronically Signed   By: Kimberley Penman M.D.   On: 05/23/2023 03:57   CT Head Wo Contrast Result Date: 05/22/2023 CLINICAL DATA:  Headache sudden, severe. EXAM: CT HEAD WITHOUT CONTRAST TECHNIQUE: Contiguous axial images were obtained from the base of the skull through the vertex without intravenous contrast. RADIATION DOSE REDUCTION: This exam was performed according to the departmental dose-optimization program which includes automated exposure control, adjustment of the mA and/or kV according to patient size and/or use of iterative reconstruction technique. COMPARISON:  None Available. FINDINGS: Brain: No acute infarct, hemorrhage, or mass lesion is present. No significant white matter lesions are present. Deep brain nuclei are within normal limits. The ventricles are of normal size. No significant extraaxial fluid collection is present. The brainstem and cerebellum are within normal limits. Midline structures are within normal limits. Vascular: No hyperdense vessel or unexpected calcification. Skull: Calvarium is intact. No focal lytic or blastic lesions are present. Left supraorbital frontal scalp soft tissue swelling is present. No underlying fracture or foreign body is present. No other significant extracranial soft tissue injury is present. Sinuses/Orbits: A small polyp or mucous retention cyst is present posteriorly in the right sphenoid sinus. The paranasal sinuses and mastoid air cells are otherwise clear. The globes and orbits are within normal limits. IMPRESSION: 1. Left supraorbital frontal scalp soft tissue swelling without underlying fracture or foreign body. 2. Normal CT appearance of the brain. 3. Small polyp or mucous retention cyst posteriorly in the right sphenoid sinus. Electronically Signed   By: Audree Leas M.D.   On: 05/22/2023 16:33   US  Venous Img Lower Unilateral Left Result Date: 05/22/2023 CLINICAL DATA:  Left lower extremity pain and swelling. EXAM: Left LOWER EXTREMITY VENOUS DOPPLER ULTRASOUND TECHNIQUE: Gray-scale sonography with compression, as well as color and duplex ultrasound, were performed to evaluate the deep venous system(s) from the level of the common femoral vein through the popliteal and proximal calf veins. COMPARISON:  None Available. FINDINGS: VENOUS Normal compressibility of the common femoral, superficial femoral, and popliteal veins, as well as the visualized calf veins. Visualized portions of profunda femoral vein and great saphenous vein unremarkable. No filling defects to suggest DVT on grayscale or color Doppler imaging. Doppler waveforms show normal direction of venous flow, normal respiratory plasticity and response to augmentation. Limited views of the contralateral common femoral vein are unremarkable. OTHER None. Limitations: none IMPRESSION: Negative left lower extremity venous Doppler ultrasound.  No DVT. Electronically Signed   By: Marylouise Socks.D.  On: 05/22/2023 16:28   CT Angio Chest PE W/Cm &/Or Wo Cm Result Date: 05/22/2023 CLINICAL DATA:  Right-sided chest pain.  Left-sided leg pain. EXAM: CT ANGIOGRAPHY CHEST WITH CONTRAST TECHNIQUE: Multidetector CT imaging of the chest was performed using the standard protocol during bolus administration of intravenous contrast. Multiplanar CT image reconstructions and MIPs were obtained to evaluate the vascular anatomy. RADIATION DOSE REDUCTION: This exam was performed according to the departmental dose-optimization program which includes automated exposure control, adjustment of the mA and/or kV according to patient size and/or use of iterative reconstruction technique. CONTRAST:  75mL OMNIPAQUE IOHEXOL 350 MG/ML SOLN COMPARISON:  09/01/2016 chest radiograph from [place]. No comparison CT. FINDINGS:  Cardiovascular: The quality of this exam for evaluation of pulmonary embolism is sufficient. Isolated filling defect within the posterior right upper lobe segmental and subsegmental pulmonary artery branches, relatively occlusive including on 136/9 and coronal image 78. Normal aortic caliber. Mild cardiomegaly, without pericardial effusion. Mediastinum/Nodes: Borderline to mild right hilar adenopathy is likely reactive at 1.4 cm. No mediastinal or left hilar adenopathy. Lungs/Pleura: Trace right pleural fluid. Mild paraseptal and centrilobular emphysema. Posterior right upper lobe pleural-based airspace opacity including on 34/8. 3 mm pleural-based left upper lobe pulmonary nodule on 31/8. Upper Abdomen: Multiple small gallstones. Focal steatosis adjacent the falciform ligament. Normal imaged portions of the spleen, stomach, pancreas, adrenal glands, kidneys Although there is no significant common duct dilatation, choledocholithiasis is seen including stones of up to 5 mm on 146/7 and coronal image 63. Musculoskeletal: No acute osseous abnormality. Minimal convex left thoracic spine curvature. Review of the MIP images confirms the above findings. IMPRESSION: 1. Posterior right upper lobe segmental to subsegmental pulmonary embolism, likely occlusive. Posterior right upper lobe infarct. 2. Trace right pleural fluid 3. Cholelithiasis. Choledocholithiasis without significant biliary duct dilatation. 4. Emphysema (ICD10-J43.9). left upper lobe 3 mm pulmonary nodule. No follow-up needed if patient is low-risk. Non-contrast chest CT can be considered in 12 months if patient is high-risk, nodule is upper lobe, and/or suspicious in morphology. This recommendation follows the consensus statement: Guidelines for Management of Incidental Pulmonary Nodules Detected on CT Images: From the Fleischner Society 2017; Radiology 2017; 284:228-243. Critical test results telephoned to . Dr. Cleora Daft. At the time of interpretation at .  3:45 p.m.On . 05/22/2023. Electronically Signed   By: Lore Rode M.D.   On: 05/22/2023 15:44       Impression / Plan:   38 y/o gentleman with pleuritic chest pain, found to have segmental/subsegmental PE and incidentally found to have choledocholithiasis and cholelithiasis  - NPO at midnight - recommend holding heparin drip at 4 am - will need general surgery consult at some point for consideration/timing of cholecystectomy - further recs after procedure  Please call with any questions or concerns.  Olivia Bevel MD, MPH [facility]

## 2023-05-23 NOTE — Discharge Instructions (Addendum)
 Your nurse navigator, Jayden Rudge, can be reached at [phone]  [facility] Transportation [phone]. Please call 3-5 days in advance to schedule to/from appointment  [facility] [address] [place], Wed, Thurs 8:30am-12:30pm  *showers, Pharmacologist, case management*   Rent/Utility/Housing  Agency Name: [facility] Address: [address] [place], [place] [address] Phone: [phone] Email: [email or web address] Website: [email or web address] Service(s) Offered: Housing services, self-sufficiency, congregate meal program, weatherization program, Field seismologist program, emergency food assistance,  housing counseling, home ownership program, wheels -towork program.  Agency Name: [facility] Address: [address] [place], [place] [address] Phone: [phone] (8a-4p) [phone] (8p- 10p) Email: [email or web address] Website: [email or web address] Service(s) Offered: A program for homeless and/or needy men that includes one-on-one counseling, life skills training and job rehabilitation.  Agency Name: [facility] [place] Address: [address] [place], [place] [address] Phone: [phone] Website: www.alliedchurches.org Service(s) Offered: Assistance to needy in emergency with utility bills, heating fuel, and prescriptions. Shelter for homeless 7pm-7am. May 21, 2016 15  Agency Name: [facility] [place] (Developmentally Disabled) Address: [address], [place], [place] [address] Phone: [phone] Contact Person: Genie Key Email: [email or web address] Website: [email or web address] Service(s) Offered: Helps individuals with developmental disabilities move from housing that is more restrictive to homes where they  can achieve greater independence and have more  opportunities.  Agency Name: [facility] Address: [address],  [place], [place] [address] Phone: [phone] Email: [email or web address] Website: www.[email or web address].org Service(s) Offered: Provides affordable housing for low-income families, elderly, and disabled individuals. Offer a wide range of  programs and services, from financial planning to afterschool and summer programs.  Agency Name: [facility] Address: [address] [place], [place] [address] Phone: [phone] Service(s) Offered: Child support services; child welfare services; food stamps; Medicaid; work first family assistance; and aid with fuel,  rent, food and medicine.  Agency Name: [facility] [place] [employer]. Address: [facility] [address] [place], [place]  [address] Phone: [phone] Website: www.familyabuseservices.org Service(s) Offered: 24 hour Crisis Line: [phone]; 24 hour Emergency Shelter; Transitional Housing; Support Groups; Scientist, physiological; [employer]; Hispanic Outreach: [phone];  Visitation Center: [phone].  Agency Name: [facility] [place]. Address: [address] [place], [place] [address] Phone: [phone] Service(s) Offered: CAP Services; Home and [employer]; Individual or Group Supports; Respite Care Non-Institutional Nursing;  Residential Supports; Respite Care and Personal Care Services; Transportation; Family and Friends Night; Recreational Activities; Three Nutritious Meals/Snacks; Consultation with Registered Dietician; Twenty-four hour Registered Nurse Access; Daily and [employer]; Camp Green Leaves; [place] for the [employer] (During Summer Months) Bingo Night (Every  Wednesday Night); Special Populations Dance Night  (Every Tuesday Night); Professional Hair Care Services.  Agency Name: [facility] Address: [address], [place], [place] [address] Phone: [phone] Contact Person: Richardo Chandler Website:  [email or web address].Physicist, medical) Offered: Residential treatment facility for women; food and  clothing, educational & employment development and  transportation to work; Counsellor of financial skills;  parenting and family reunification; emotional and spiritual  support; transitional housing for program graduates.  Agency Name: [facility] Address: [address] [place], [place] [address] Phone: [phone] Email: [email or web address] Website: [email or web address].es Service(s) Offered: Public housing units for elderly, disabled, and low income people; housing choice vouchers for income eligible  applicants; shelter plus care vouchers; and Psychologist, clinical.  Agency Name: [facility] [employer] Address: [address] [place], [place] [address] Phone:  [phone] Email: [email or web address] Website: www.habitatalamance.org Service(s) Offered: Build houses for families in need of decent housing. Each adult in the family must invest 200 hours of labor on  someone else's house, work with volunteers to build their own house, attend classes on budgeting, home maintenance, yard care, and attend homeowner association meetings.  Agency Name: [facility] Address: [address] [place], [place] [address] Phone: [phone] Website: www.rsli.org Service(s) Offered: Intermediate care facilities for intellectually delayed, Supervised Living in group homes for adults with developmental disabilities, Supervised Living for people who have dual diagnoses (MRMI), Independent Living, Supported Living, respite and a variety of CAP services, pre-vocational services, day supports, and [employer].  Agency Name: [place] [employer] [facility] Phone: [phone] Website: www.NCForeclosurePrevention.gov Service(s) Offered: Zero-interest, deferred loans to homeowners struggling to pay their mortgage. Call  for more information.  Shelters Resource List  [employer] RESCUE MISSION PROVIDED BY: [facility] [address] [place], [place], [place] Offers a faith-based shelter for homeless men, usually with substance use disorders. Residents receive counseling, life skills training, and help finding a job.  HOMELESS SHELTER PROVIDED BY: [facility] [address] [place], [place], [place] Offers a shelter for men, women, and families experiencing homelessness. Food, clothing and other items are available for residents. Also offers support and services to help residents become self-sufficient. Offers temporary emergency housing for 30 days. Additional shelter may be available when temperatures drop below freezing but is not guaranteed.  FAMILY ABUSE SERVICES OF [facility] COUNTY PROVIDED BY: FAMILY ABUSE SERVICES OF [facility] [address], [place], [place] Offers services for victims of domestic violence. Offers a 24-hour crisis line and emergency shelter. Offers information and referrals to other community resources. Also offers court advocacy and support groups.  HOUSING CHOICE VOUCHER PROGRAM PROVIDED BY: [facility] [place] [address], [place], [place] Offers vouchers for approved Section 8 properties. Vouchers offer financial help with rent    FRUIT TREE MINISTRIES PROVIDED BY: [facility], [place], [place] Offers emergency shelter for victims of domestic violence. Also offers a 24-hour crisis hotline for victims of domestic violence, safety planning, information and referrals, case management, and support groups for victims of domestic violence.   ACT TOGETHER EMERGENCY SHELTER PROVIDED BY: [facility] [address], [place], [place] Offers a 21-day emergency shelter for youth experiencing a family crisis, abuse, or homelessness. Case management, supportive services, healthcare services, and more are  available for residents. SHELTER PROVIDED BY: [facility] [address], [place], [place] Offers a homeless shelter for people and families. Meals, showers, community referrals, case management, and more are available for residents. HEARTH TRANSITIONAL LIVING PROGRAM PROVIDED BY: [facility] [address], [place], [place] Offers an 51-month homeless shelter for younger adults experiencing homelessness. Case management, independent living skills education, and more are available for residents. PARTNERSHIP VILLAGE PROVIDED BY: [place] [place] [address], [place], [place] Offers transitional housing for families and single people experiencing homelessness. Residents meet regularly with a case manager to work towards self-sufficiency TRANSITIONAL HOUSING PROVIDED BY: [facility] [address], [place], [place] Offers transitional housing for male veterans with disabilities. Residents receive meals, transportation, and clothing. Also offers support groups, nutrition classes, and peer support to residents.   WEAVER HOUSE PROVIDED BY: [place] [facility] [address], [place], [place] Offers shelter to adult men and women. Guests receive hot meals and case management. Also offers overnight shelter when temperatures drop during cold winter months  EMERGENCY FAMILY SHELTER PROVIDED BY: [facility] [place] [address], [place], [place] Offers shelter and support services for families experiencing homelessness.  Some PCP options in [place] area- not a comprehensive list  [facility]- [phone] [facility]- [phone] [facility]- [phone] [facility]- [phone] [facility]- [phone] Kerney Pee- [phone]  or [facility] Physician Referral Line [phone]   Transportation Resources for [facility]  Agency Name: [facility] Address: [address] [place], [place] [address] Phone: [phone] Email: [email or web address] Website: www.[email or web address].org Service(s) Offered: Housing services, self-sufficiency, congregate meal program, weatherization program, Field seismologist program, emergency food assistance,  housing counseling, home ownership program, wheels-towork program.  Agency Name: [facility] [employer] [phone]) Address: [address] [place], [place] [address] Phone: [phone] Website: [email or web address].acta-Rentz.[email or web address] Service(s) Offered: Transportation for [employer], subscription and demand response; Dial-a-Ride for citizens 22 years of age or older.  Agency Name: Department of Social Services Address: [address] [place], [place] [address] Phone: [phone] Service(s) Offered: Child support services; child welfare services; food stamps; Medicaid; work first family assistance; and aid with fuel,  rent, food and medicine, transportation assistance.  Agency Name: Disabled [employer] (DAV) Transportation  Network Phone: [phone] Service(s) Offered: Transports veterans to the [facility] medical center. Call  forty-eight hours in advance and leave the name, telephone  number, date, and time of appointment. Veteran will be  contacted by the driver the day before the appointment to  arrange a pick up point    [employer] ACTA currently provides door to door services. ACTA connects with PART daily for services to [facility]. ACTA also performs contract services to [employer] operates 27 vehicles, all but 3 mini-vans are equipped with lifts for special needs as well as the general public. ACTA drivers are each CDL certified and trained in First Aid and CPR. ACTA was established in 2002 by [employer]. An independent Industrial/product designer. ACTA operates via Cytogeneticist  with required local 10% match funding from [place]. ACTA provides over 80,000 passenger trips each year, including [facility] and [employer] sites.  Call at least by 11 AM one business day prior to needing transportation  [employer].                      [place], [place] [address]     Office Hours: Monday-Friday  8 AM - 5 PM

## 2023-05-23 NOTE — TOC Initial Note (Signed)
 Transition of Care [facility]) - Initial/Assessment Note    Patient Details  Name: Todd Ware MRN: [id number] Date of Birth: Nov 30, 1985  [facility] of [facility]) CM/SW Contact:    Alexandra Ice, RN Phone Number: 05/23/2023, 10:48 AM  Clinical Narrative:                 Transition of Care [facility]) - Inpatient Brief Assessment   Patient Details  Name: Todd Ware MRN: [id number] Date of Birth: 1985-08-23  Transition of [facility]) CM/SW Contact:    Alexandra Ice, RN Phone Number: 05/23/2023, 10:48 AM   Clinical Narrative: Brief assessment completed. Patient is independent with ADLs. He reports he stays with friends and hotels. He reports he needs medication assistance. Patient has [place] medicaid, will monitor what medication patient has a discharge and will refer to outpatient pharmacy. Added housing, homeless shelter, PCP list and transportation information to AVS. TOC will continue to monitor for additional discharge needs.    Transition of Care Asessment: Insurance and Status: Insurance coverage has been reviewed Patient has primary care physician: No Home environment has been reviewed: He stays in hotels and at friends houses Prior level of function:: independent Prior/Current Home Services: No current home services Social Drivers of Health Review: SDOH reviewed interventions complete Readmission risk has been reviewed: Yes Transition of care needs: transition of care needs identified, TOC will continue to follow         Patient Goals and CMS Choice            Expected Discharge Plan and Services                                              Prior Living Arrangements/Services                       Activities of Daily Living   ADL Screening (condition at time of admission) Independently performs ADLs?: Yes (appropriate for developmental age) Is the patient deaf or have difficulty hearing?: No Does the patient have  difficulty seeing, even when wearing glasses/contacts?: No Does the patient have difficulty concentrating, remembering, or making decisions?: No  Permission Sought/Granted                  Emotional Assessment              Admission diagnosis:  Pulmonary embolism (HCC) [I26.99] Left leg swelling [M79.89] Acute nonintractable headache, unspecified headache type [R51.9] Acute pulmonary embolism without acute cor pulmonale, unspecified pulmonary embolism type (HCC) [I26.99] Patient Active Problem List   Diagnosis Date Noted   Pulmonary embolism (HCC) 05/22/2023   Choledocholithiasis 05/22/2023   Tobacco abuse 05/22/2023   Polysubstance abuse (HCC) 05/22/2023   Bipolar depression (HCC) 08/11/2018   Bipolar 1 disorder with moderate mania (HCC) 08/10/2018   PCP:  Patient, No Pcp Per Pharmacy:   [facility] [address] [place] [address] Phone: [phone] Fax: [phone]     Social Drivers of Health (SDOH) Social History: SDOH Screenings   Food Insecurity: Food Insecurity Present (05/22/2023)  Housing: High Risk (05/22/2023)  Transportation Needs: Unmet Transportation Needs (05/22/2023)  Utilities: Not At Risk (05/22/2023)  Alcohol Screen: Medium Risk (08/10/2018)  Tobacco Use: High Risk (05/22/2023)   SDOH Interventions:     Readmission Risk Interventions  No data to display

## 2023-05-23 NOTE — Progress Notes (Signed)
 PHARMACY - ANTICOAGULATION CONSULT NOTE  Pharmacy Consult for heparin drip Indication: pulmonary embolus  No Known Allergies  Patient Measurements: Height: 6' (182.9 cm) Weight: 113.4 kg (250 lb) IBW/kg (Calculated) : 77.6 HEPARIN DW (KG): 101.9  Vital Signs: Temp: 97.8 F (36.6 C) (04/27 0412) Temp Source: Oral (04/27 0412) BP: 139/90 (04/27 0412) Pulse Rate: 49 (04/27 0412)  Labs: Recent Labs    05/22/23 1251 05/22/23 1350 05/22/23 1653 05/22/23 2348 05/23/23 0601  HGB 14.1  --   --   --  13.4  HCT 42.6  --   --   --  40.0  PLT 170  --   --   --  168  APTT  --   --  32  --   --   LABPROT  --   --  13.5  --   --   INR  --   --  1.0  --   --   HEPARINUNFRC  --   --   --  0.49 0.54  CREATININE  --  0.65  --   --   --   TROPONINIHS 4 4  --   --   --     Estimated Creatinine Clearance: 167.5 mL/min (by C-G formula based on SCr of 0.65 mg/dL).   Medical History: Past Medical History:  Diagnosis Date   Seizures (HCC)     Medications:  No medications prior to admission.   Scheduled:   nicotine  21 mg Transdermal Daily   Infusions:   sodium chloride 75 mL/hr at 05/22/23 2027   heparin 1,700 Units/hr (05/23/23 0617)    Assessment: 38 yo male to start heparin drip for PE. Hx: bipolar No anticoagulants PTA Baseline :  Hgb 14.1  Plt 170   aPTT 32   INR 1.0  Goal of Therapy:  Heparin level 0.3-0.7 units/ml Monitor platelets by anticoagulation protocol: Yes   Plan:  4/27:  HL @ 0601 = 0.54, therapeutic X 2 - Will continue pt on current rate and recheck HL on 4/28 with AM labs.  Marketa Midkiff D Clinical Pharmacist 05/23/2023

## 2023-05-23 NOTE — Progress Notes (Addendum)
 Progress Note    Todd Ware  ZOX:[id number] DOB: 1985-05-20  DOA: 05/22/2023 PCP: Patient, No Pcp Per      Brief Narrative:    Medical records reviewed and are as summarized below:  Todd Ware is a 38 y.o. male with medical history significant for obesity, polysubstance use disorder (including tobacco and opiates), who presented to the hospital with pleuritic chest pain.  No recent surgery or hospitalization.  No family history of blood clots.  He was recently released from prison about 2 months ago.  He is homeless and staying at different hospitals and friends houses.   He was found to have acute pulmonary embolism and choledocholithiasis.       Assessment/Plan:   Principal Problem:   Pulmonary embolism (HCC) Active Problems:   Choledocholithiasis   Tobacco abuse   Polysubstance abuse (HCC)    Body mass index is 33.91 kg/m.  (Obesity)   Acute pulmonary embolism, chest pain: Troponins were negative. Discontinue IV fluids Oxycodone  IV morphine as needed for severe chest pain.  Continue IV heparin drip and monitor heparin level per protocol.  Discussed management of pulmonary embolism, risks and benefits of long-term anticoagulation.  He is agreeable to treatment.  Plan to transition to Eliquis after surgical procedures are completed   Cholelithiasis, choledocholithiasis: Mid common bile duct measures up to 8 mm.  Liver enzymes are normal.  Consulted Dr. Emerick Hanlon, gastroenterologist, to assist with treatment.  He recommended IV heparin be held on 05/24/2023 at 4 AM.  Order has been placed for this.  Discussed with Sangita, RN.   Polysubstance use disorder, tobacco use disorder: Counseled to quit.  Continue nicotine patch   Homelessness: Consult TOC to assist with social needs and medications.   Diet Order             Diet heart healthy/carb modified Room service appropriate? Yes; Fluid consistency: Thin  Diet effective now                             Consultants: None  Procedures: None    Medications:    nicotine  21 mg Transdermal Daily   Continuous Infusions:  sodium chloride 75 mL/hr at 05/23/23 0835   heparin 1,700 Units/hr (05/23/23 0617)     Anti-infectives (From admission, onward)    None              Family Communication/Anticipated D/C date and plan/Code Status   DVT prophylaxis:      Code Status: Full Code  Family Communication: None Disposition Plan: Plan to discharge home   Status is: Observation The patient will require care spanning > 2 midnights and should be moved to inpatient because: Acute pulmonary embolism       Subjective:   Interval events noted.  He complains of pleuritic chest pain.  No shortness of breath, cough or hemoptysis  Objective:    Vitals:   05/22/23 1859 05/22/23 1956 05/23/23 0412 05/23/23 0721  BP: 118/84 (!) 124/97 (!) 139/90 (!) 135/94  Pulse: (!) 53 (!) 54 (!) 49 (!) 53  Resp: 16 17 18 16   Temp:  97.8 F (36.6 C) 97.8 F (36.6 C) 98 F (36.7 C)  TempSrc:  Oral Oral   SpO2: 100% 100% 100%   Weight:      Height:       No data found.   Intake/Output Summary (Last 24 hours) at 05/23/2023 1142 Last data  filed at 05/23/2023 0935 Gross per 24 hour  Intake 480 ml  Output 1650 ml  Net -1170 ml   Filed Weights   05/22/23 1214  Weight: 113.4 kg    Exam:  GEN: NAD SKIN: No rash EYES: No pallor or icterus ENT: MMM CV: RRR PULM: CTA B ABD: soft, obese, NT, +BS CNS: AAO x 3, non focal EXT: No edema or tenderness        Data Reviewed:   I have personally reviewed following labs and imaging studies:  Labs: Labs show the following:   Basic Metabolic Panel: Recent Labs  Lab 05/22/23 1350 05/23/23 0601  NA 137 136  K 3.6 3.8  CL 103 105  CO2 24 28  GLUCOSE 87 89  BUN 11 10  CREATININE 0.65 0.74  CALCIUM 8.7* 8.1*   GFR Estimated Creatinine Clearance: 167.5 mL/min (by C-G formula based on SCr of  0.74 mg/dL). Liver Function Tests: Recent Labs  Lab 05/22/23 1350 05/23/23 0601  AST 17 13*  ALT 11 10  ALKPHOS 58 56  BILITOT 0.8 0.5  PROT 6.6 6.2*  ALBUMIN 3.4* 3.1*   No results for input(s): "LIPASE", "AMYLASE" in the last 168 hours. No results for input(s): "AMMONIA" in the last 168 hours. Coagulation profile Recent Labs  Lab 05/22/23 1653  INR 1.0    CBC: Recent Labs  Lab 05/22/23 1251 05/23/23 0601  WBC 4.3 3.9*  NEUTROABS 2.9  --   HGB 14.1 13.4  HCT 42.6 40.0  MCV 87.7 87.1  PLT 170 168   Cardiac Enzymes: No results for input(s): "CKTOTAL", "CKMB", "CKMBINDEX", "TROPONINI" in the last 168 hours. BNP (last 3 results) No results for input(s): "PROBNP" in the last 8760 hours. CBG: No results for input(s): "GLUCAP" in the last 168 hours. D-Dimer: No results for input(s): "DDIMER" in the last 72 hours. Hgb A1c: No results for input(s): "HGBA1C" in the last 72 hours. Lipid Profile: No results for input(s): "CHOL", "HDL", "LDLCALC", "TRIG", "CHOLHDL", "LDLDIRECT" in the last 72 hours. Thyroid function studies: No results for input(s): "TSH", "T4TOTAL", "T3FREE", "THYROIDAB" in the last 72 hours.  Invalid input(s): "FREET3" Anemia work up: No results for input(s): "VITAMINB12", "FOLATE", "FERRITIN", "TIBC", "IRON", "RETICCTPCT" in the last 72 hours. Sepsis Labs: Recent Labs  Lab 05/22/23 1251 05/23/23 0601  WBC 4.3 3.9*    Microbiology No results found for this or any previous visit (from the past 240 hours).  Procedures and diagnostic studies:  MR ABDOMEN MRCP W WO CONTAST Result Date: 05/23/2023 CLINICAL DATA:  Right upper quadrant abdominal pain. Evaluate for choledocholithiasis. EXAM: MRI ABDOMEN WITHOUT AND WITH CONTRAST (INCLUDING MRCP) TECHNIQUE: Multiplanar multisequence MR imaging of the abdomen was performed both before and after the administration of intravenous contrast. Heavily T2-weighted images of the biliary and pancreatic ducts were  obtained, and three-dimensional MRCP images were rendered by post processing. CONTRAST:  10mL GADAVIST GADOBUTROL 1 MMOL/ML IV SOLN COMPARISON:  None Available. FINDINGS: Lower chest: No acute findings. Hepatobiliary: No focal liver abnormality. Multiple stones identified within the gallbladder. The largest stone is in the neck measuring 1.1 cm, image 17/9. Gallbladder appears decompressed. No significant gallbladder wall thickening or pericholecystic inflammation. Multiple stones identified within the mid and distal common bile duct which measure up to 9 mm. Mild fusiform dilatation of the common bile duct measures up to 8 mm at the level of the mid duct. Mild central biliary dilatation. Pancreas: No mass, inflammatory changes, or other parenchymal abnormality identified. Spleen:  Within  normal limits in size and appearance. Adrenals/Urinary Tract: No masses identified. No evidence of hydronephrosis. Stomach/Bowel: The stomach appears normal. There is no dilated loops of large or small bowel. Vascular/Lymphatic: No pathologically enlarged lymph nodes identified. No abdominal aortic aneurysm demonstrated. Other:  No ascites or focal fluid collections. Musculoskeletal: No suspicious bone lesions identified. IMPRESSION: 1. Cholelithiasis without secondary signs of acute cholecystitis. 2. Choledocholithiasis with mild fusiform dilatation of the common bile duct and mild central biliary dilatation. The mid common bile duct measures up to 8 mm. Electronically Signed   By: Kimberley Penman M.D.   On: 05/23/2023 03:57   MR 3D Recon At Scanner Result Date: 05/23/2023 CLINICAL DATA:  Right upper quadrant abdominal pain. Evaluate for choledocholithiasis. EXAM: MRI ABDOMEN WITHOUT AND WITH CONTRAST (INCLUDING MRCP) TECHNIQUE: Multiplanar multisequence MR imaging of the abdomen was performed both before and after the administration of intravenous contrast. Heavily T2-weighted images of the biliary and pancreatic ducts were  obtained, and three-dimensional MRCP images were rendered by post processing. CONTRAST:  10mL GADAVIST GADOBUTROL 1 MMOL/ML IV SOLN COMPARISON:  None Available. FINDINGS: Lower chest: No acute findings. Hepatobiliary: No focal liver abnormality. Multiple stones identified within the gallbladder. The largest stone is in the neck measuring 1.1 cm, image 17/9. Gallbladder appears decompressed. No significant gallbladder wall thickening or pericholecystic inflammation. Multiple stones identified within the mid and distal common bile duct which measure up to 9 mm. Mild fusiform dilatation of the common bile duct measures up to 8 mm at the level of the mid duct. Mild central biliary dilatation. Pancreas: No mass, inflammatory changes, or other parenchymal abnormality identified. Spleen:  Within normal limits in size and appearance. Adrenals/Urinary Tract: No masses identified. No evidence of hydronephrosis. Stomach/Bowel: The stomach appears normal. There is no dilated loops of large or small bowel. Vascular/Lymphatic: No pathologically enlarged lymph nodes identified. No abdominal aortic aneurysm demonstrated. Other:  No ascites or focal fluid collections. Musculoskeletal: No suspicious bone lesions identified. IMPRESSION: 1. Cholelithiasis without secondary signs of acute cholecystitis. 2. Choledocholithiasis with mild fusiform dilatation of the common bile duct and mild central biliary dilatation. The mid common bile duct measures up to 8 mm. Electronically Signed   By: Kimberley Penman M.D.   On: 05/23/2023 03:57   CT Head Wo Contrast Result Date: 05/22/2023 CLINICAL DATA:  Headache sudden, severe. EXAM: CT HEAD WITHOUT CONTRAST TECHNIQUE: Contiguous axial images were obtained from the base of the skull through the vertex without intravenous contrast. RADIATION DOSE REDUCTION: This exam was performed according to the departmental dose-optimization program which includes automated exposure control, adjustment of the  mA and/or kV according to patient size and/or use of iterative reconstruction technique. COMPARISON:  None Available. FINDINGS: Brain: No acute infarct, hemorrhage, or mass lesion is present. No significant white matter lesions are present. Deep brain nuclei are within normal limits. The ventricles are of normal size. No significant extraaxial fluid collection is present. The brainstem and cerebellum are within normal limits. Midline structures are within normal limits. Vascular: No hyperdense vessel or unexpected calcification. Skull: Calvarium is intact. No focal lytic or blastic lesions are present. Left supraorbital frontal scalp soft tissue swelling is present. No underlying fracture or foreign body is present. No other significant extracranial soft tissue injury is present. Sinuses/Orbits: A small polyp or mucous retention cyst is present posteriorly in the right sphenoid sinus. The paranasal sinuses and mastoid air cells are otherwise clear. The globes and orbits are within normal limits. IMPRESSION: 1. Left supraorbital frontal  scalp soft tissue swelling without underlying fracture or foreign body. 2. Normal CT appearance of the brain. 3. Small polyp or mucous retention cyst posteriorly in the right sphenoid sinus. Electronically Signed   By: Audree Leas M.D.   On: 05/22/2023 16:33   US  Venous Img Lower Unilateral Left Result Date: 05/22/2023 CLINICAL DATA:  Left lower extremity pain and swelling. EXAM: Left LOWER EXTREMITY VENOUS DOPPLER ULTRASOUND TECHNIQUE: Gray-scale sonography with compression, as well as color and duplex ultrasound, were performed to evaluate the deep venous system(s) from the level of the common femoral vein through the popliteal and proximal calf veins. COMPARISON:  None Available. FINDINGS: VENOUS Normal compressibility of the common femoral, superficial femoral, and popliteal veins, as well as the visualized calf veins. Visualized portions of profunda femoral vein and  great saphenous vein unremarkable. No filling defects to suggest DVT on grayscale or color Doppler imaging. Doppler waveforms show normal direction of venous flow, normal respiratory plasticity and response to augmentation. Limited views of the contralateral common femoral vein are unremarkable. OTHER None. Limitations: none IMPRESSION: Negative left lower extremity venous Doppler ultrasound.  No DVT. Electronically Signed   By: Audree Leas M.D.   On: 05/22/2023 16:28   CT Angio Chest PE W/Cm &/Or Wo Cm Result Date: 05/22/2023 CLINICAL DATA:  Right-sided chest pain.  Left-sided leg pain. EXAM: CT ANGIOGRAPHY CHEST WITH CONTRAST TECHNIQUE: Multidetector CT imaging of the chest was performed using the standard protocol during bolus administration of intravenous contrast. Multiplanar CT image reconstructions and MIPs were obtained to evaluate the vascular anatomy. RADIATION DOSE REDUCTION: This exam was performed according to the departmental dose-optimization program which includes automated exposure control, adjustment of the mA and/or kV according to patient size and/or use of iterative reconstruction technique. CONTRAST:  75mL OMNIPAQUE IOHEXOL 350 MG/ML SOLN COMPARISON:  09/01/2016 chest radiograph from [place]. No comparison CT. FINDINGS: Cardiovascular: The quality of this exam for evaluation of pulmonary embolism is sufficient. Isolated filling defect within the posterior right upper lobe segmental and subsegmental pulmonary artery branches, relatively occlusive including on 136/9 and coronal image 78. Normal aortic caliber. Mild cardiomegaly, without pericardial effusion. Mediastinum/Nodes: Borderline to mild right hilar adenopathy is likely reactive at 1.4 cm. No mediastinal or left hilar adenopathy. Lungs/Pleura: Trace right pleural fluid. Mild paraseptal and centrilobular emphysema. Posterior right upper lobe pleural-based airspace opacity including on 34/8. 3 mm pleural-based left upper  lobe pulmonary nodule on 31/8. Upper Abdomen: Multiple small gallstones. Focal steatosis adjacent the falciform ligament. Normal imaged portions of the spleen, stomach, pancreas, adrenal glands, kidneys Although there is no significant common duct dilatation, choledocholithiasis is seen including stones of up to 5 mm on 146/7 and coronal image 63. Musculoskeletal: No acute osseous abnormality. Minimal convex left thoracic spine curvature. Review of the MIP images confirms the above findings. IMPRESSION: 1. Posterior right upper lobe segmental to subsegmental pulmonary embolism, likely occlusive. Posterior right upper lobe infarct. 2. Trace right pleural fluid 3. Cholelithiasis. Choledocholithiasis without significant biliary duct dilatation. 4. Emphysema (ICD10-J43.9). left upper lobe 3 mm pulmonary nodule. No follow-up needed if patient is low-risk. Non-contrast chest CT can be considered in 12 months if patient is high-risk, nodule is upper lobe, and/or suspicious in morphology. This recommendation follows the consensus statement: Guidelines for Management of Incidental Pulmonary Nodules Detected on CT Images: From the Fleischner Society 2017; Radiology 2017; 284:228-243. Critical test results telephoned to . Dr. Cleora Daft. At the time of interpretation at . 3:45 p.m.On . 05/22/2023. Electronically Signed  By: Lore Rode M.D.   On: 05/22/2023 15:44               LOS: 0 days   Arnaldo Heffron  [facility]   Pager on [email or web address]. If 7PM-7AM, please contact night-coverage at [email or web address]     05/23/2023, 11:42 AM

## 2023-05-23 NOTE — Plan of Care (Signed)
  Problem: Education: Goal: Knowledge of General Education information will improve Description: Including pain rating scale, medication(s)/side effects and non-pharmacologic comfort measures Outcome: Progressing   Problem: Health Behavior/Discharge Planning: Goal: Ability to manage health-related needs will improve Outcome: Progressing   Problem: Clinical Measurements: Goal: Ability to maintain clinical measurements within normal limits will improve Outcome: Progressing Goal: Will remain free from infection Outcome: Progressing Goal: Diagnostic test results will improve Outcome: Progressing Goal: Respiratory complications will improve Outcome: Progressing Goal: Cardiovascular complication will be avoided Outcome: Progressing   Problem: Nutrition: Goal: Adequate nutrition will be maintained Outcome: Progressing   Problem: Elimination: Goal: Will not experience complications related to bowel motility Outcome: Progressing Goal: Will not experience complications related to urinary retention Outcome: Progressing   Problem: Pain Managment: Goal: General experience of comfort will improve and/or be controlled Outcome: Progressing   Problem: Safety: Goal: Ability to remain free from injury will improve Outcome: Progressing   Problem: Skin Integrity: Goal: Risk for impaired skin integrity will decrease Outcome: Progressing

## 2023-05-23 NOTE — Progress Notes (Signed)
 PHARMACY - ANTICOAGULATION CONSULT NOTE  Pharmacy Consult for heparin drip Indication: pulmonary embolus  No Known Allergies  Patient Measurements: Height: 6' (182.9 cm) Weight: 113.4 kg (250 lb) IBW/kg (Calculated) : 77.6 HEPARIN DW (KG): 101.9  Vital Signs: Temp: 97.8 F (36.6 C) (04/26 1956) Temp Source: Oral (04/26 1956) BP: 124/97 (04/26 1956) Pulse Rate: 54 (04/26 1956)  Labs: Recent Labs    05/22/23 1251 05/22/23 1350 05/22/23 1653 05/22/23 2348  HGB 14.1  --   --   --   HCT 42.6  --   --   --   PLT 170  --   --   --   APTT  --   --  32  --   LABPROT  --   --  13.5  --   INR  --   --  1.0  --   HEPARINUNFRC  --   --   --  0.49  CREATININE  --  0.65  --   --   TROPONINIHS 4 4  --   --     Estimated Creatinine Clearance: 167.5 mL/min (by C-G formula based on SCr of 0.65 mg/dL).   Medical History: Past Medical History:  Diagnosis Date   Seizures (HCC)     Medications:  No medications prior to admission.   Scheduled:   nicotine  21 mg Transdermal Daily   Infusions:   sodium chloride 75 mL/hr at 05/22/23 2027   heparin 1,700 Units/hr (05/22/23 1703)    Assessment: 38 yo male to start heparin drip for PE. Hx: bipolar No anticoagulants PTA Baseline :  Hgb 14.1  Plt 170   aPTT 32   INR 1.0  Goal of Therapy:  Heparin level 0.3-0.7 units/ml Monitor platelets by anticoagulation protocol: Yes   Plan:  4/26:   HL @ 2348 = 0.49, therapeutic X 1 - Will continue pt on current rate and recheck HL in 6 hrs on 4/27 @ 0600.  Max Romano D Clinical Pharmacist 05/23/2023

## 2023-05-23 NOTE — Plan of Care (Signed)

## 2023-05-23 NOTE — Progress Notes (Signed)
 Introduced patient to role Statistician. Intake questions completed.   Patient was recently released from state prison about 2 months ago. Unfortunately the halfway house that he was told he was being released into denied him. So he has been effectively homeless since then. Patient states he stays with friends and at hotels when he can afford it.  Patient is currently on parole. Patient's parole officer is Copywriter, advertising. Patient states he is required to meet every 2 weeks at the parole office. Patient walks to appointment. Patient will be missing an appointment tomorrow due to his hospitalization. Will provide note for parole officer.  Patient states he has been doing "odd jobs" to help get money for food, etc. 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 Patient is actively looking for employment. Provided with list of local employers that hire felons.   Patient does NOT have a PCP. Patient agreeable to establishment of care. Patient also states he would like to get established with mental health provider as he does have a history of Bipolar Disorder and PTSD. Will assist.  Patient does NOT drive, and has never had a license. Needs transportation assistance to/from appointments. Currently walks everywhere.   Patient states he "thinks" he filled out applications for Medicaid and food stamps prior to release from prison, but isn't sure. Will follow up on this.  Encouraged to call with any questions or concerns.

## 2023-05-24 ENCOUNTER — Inpatient Hospital Stay: Admitting: Anesthesiology

## 2023-05-24 ENCOUNTER — Other Ambulatory Visit (HOSPITAL_COMMUNITY): Payer: Self-pay

## 2023-05-24 ENCOUNTER — Encounter: Payer: Self-pay | Admitting: Gastroenterology

## 2023-05-24 ENCOUNTER — Telehealth (HOSPITAL_COMMUNITY): Payer: Self-pay | Admitting: Pharmacy Technician

## 2023-05-24 ENCOUNTER — Inpatient Hospital Stay

## 2023-05-24 ENCOUNTER — Encounter
Admission: EM | Discharge status: Home / Self Care | Payer: Self-pay | Source: Home / Self Care | Attending: Internal Medicine

## 2023-05-24 DIAGNOSIS — I2699 Other pulmonary embolism without acute cor pulmonale: Secondary | ICD-10-CM | POA: Diagnosis not present

## 2023-05-24 DIAGNOSIS — K8070 Calculus of gallbladder and bile duct without cholecystitis without obstruction: Secondary | ICD-10-CM | POA: Diagnosis not present

## 2023-05-24 HISTORY — PX: ERCP: SHX5425

## 2023-05-24 HISTORY — PX: SPYGLASS LITHOTRIPSY: SHX5537

## 2023-05-24 LAB — CBC
HCT: 41.2 % (ref 39.0–52.0)
Hemoglobin: 13.8 g/dL (ref 13.0–17.0)
MCH: 29.2 pg (ref 26.0–34.0)
MCHC: 33.5 g/dL (ref 30.0–36.0)
MCV: 87.1 fL (ref 80.0–100.0)
Platelets: 178 10*3/uL (ref 150–400)
RBC: 4.73 MIL/uL (ref 4.22–5.81)
RDW: 13.4 % (ref 11.5–15.5)
WBC: 4.5 10*3/uL (ref 4.0–10.5)
nRBC: 0 % (ref 0.0–0.2)

## 2023-05-24 LAB — HEPARIN LEVEL (UNFRACTIONATED)
Heparin Unfractionated: 0.53 [IU]/mL (ref 0.30–0.70)
Heparin Unfractionated: 0.47 [IU]/mL (ref 0.30–0.70)

## 2023-05-24 SURGERY — ERCP, WITH INTERVENTION IF INDICATED
Anesthesia: General

## 2023-05-24 MED ORDER — ONDANSETRON HCL 4 MG/2ML IJ SOLN
INTRAMUSCULAR | Status: AC
  Filled 2023-05-24: qty 2

## 2023-05-24 MED ORDER — DICLOFENAC SUPPOSITORY 100 MG
RECTAL | Status: AC
  Filled 2023-05-24: qty 1

## 2023-05-24 MED ORDER — DEXMEDETOMIDINE HCL IN NACL 80 MCG/20ML IV SOLN
INTRAVENOUS | Status: DC | PRN
  Administered 2023-05-24 (×3): 4 ug via INTRAVENOUS

## 2023-05-24 MED ORDER — MIDAZOLAM HCL 2 MG/2ML IJ SOLN
INTRAMUSCULAR | Status: DC | PRN
  Administered 2023-05-24: 2 mg via INTRAVENOUS

## 2023-05-24 MED ORDER — GLYCOPYRROLATE 0.2 MG/ML IJ SOLN
INTRAMUSCULAR | Status: DC | PRN
  Administered 2023-05-24: .2 mg via INTRAVENOUS

## 2023-05-24 MED ORDER — MIDAZOLAM HCL 2 MG/2ML IJ SOLN
INTRAMUSCULAR | Status: AC
  Filled 2023-05-24: qty 2

## 2023-05-24 MED ORDER — LIDOCAINE HCL (PF) 2 % IJ SOLN
INTRAMUSCULAR | Status: AC
Start: 2023-05-24 — End: ?
  Filled 2023-05-24: qty 15

## 2023-05-24 MED ORDER — DICLOFENAC SUPPOSITORY 100 MG
RECTAL | Status: DC | PRN
  Administered 2023-05-24: 100 mg via RECTAL

## 2023-05-24 MED ORDER — LIDOCAINE HCL (CARDIAC) PF 100 MG/5ML IV SOSY
PREFILLED_SYRINGE | INTRAVENOUS | Status: DC | PRN
  Administered 2023-05-24: 100 mg via INTRAVENOUS

## 2023-05-24 MED ORDER — ONDANSETRON HCL 4 MG/2ML IJ SOLN
INTRAMUSCULAR | Status: DC | PRN
  Administered 2023-05-24: 4 mg via INTRAVENOUS

## 2023-05-24 MED ORDER — DEXMEDETOMIDINE HCL IN NACL 80 MCG/20ML IV SOLN
INTRAVENOUS | Status: AC
  Filled 2023-05-24: qty 20

## 2023-05-24 MED ORDER — PROPOFOL 500 MG/50ML IV EMUL
INTRAVENOUS | Status: DC | PRN
  Administered 2023-05-24: 200 ug/kg/min via INTRAVENOUS

## 2023-05-24 MED ORDER — PROPOFOL 10 MG/ML IV BOLUS
INTRAVENOUS | Status: DC | PRN
  Administered 2023-05-24: 30 mg via INTRAVENOUS
  Administered 2023-05-24: 100 mg via INTRAVENOUS

## 2023-05-24 MED ORDER — HEPARIN (PORCINE) 25000 UT/250ML-% IV SOLN
1700.0000 [IU]/h | INTRAVENOUS | Status: DC
  Administered 2023-05-24: 1700 [IU]/h via INTRAVENOUS
  Filled 2023-05-24: qty 250

## 2023-05-24 MED ORDER — DICLOFENAC SUPPOSITORY 100 MG: 100.0000 mg | Freq: Once | RECTAL | Status: DC

## 2023-05-24 MED ORDER — GLYCOPYRROLATE 0.2 MG/ML IJ SOLN
INTRAMUSCULAR | Status: AC
  Filled 2023-05-24: qty 1

## 2023-05-24 MED ORDER — LACTATED RINGERS IV SOLN
INTRAVENOUS | Status: DC
Start: 2023-05-24 — End: 2023-05-24
  Administered 2023-05-24: 40 mL/h via INTRAVENOUS

## 2023-05-24 MED ORDER — ACETAMINOPHEN 325 MG PO TABS
650.0000 mg | ORAL_TABLET | Freq: Four times a day (QID) | ORAL | Status: DC | PRN
  Administered 2023-05-25: 650 mg via ORAL
  Filled 2023-05-24: qty 2

## 2023-05-24 NOTE — Transfer of Care (Signed)
 Immediate Anesthesia Transfer of Care Note  Patient: Todd Ware  Procedure(s) Performed: ERCP, WITH INTERVENTION IF INDICATED ERCP, WITH CHOLANGIOSCOPY AND LITHOTRIPSY  Patient Location: Endoscopy Unit  Anesthesia Type:General  Level of Consciousness: drowsy and patient cooperative  Airway & Oxygen Therapy: Patient Spontanous Breathing and Patient connected to nasal cannula oxygen  Post-op Assessment: Report given to RN, Post -op Vital signs reviewed and stable, and Patient moving all extremities X 4  Post vital signs: Reviewed and stable  Last Vitals:  Vitals Value Taken Time  BP 110/78 05/24/23 1309  Temp 37.1 C 05/24/23 1309  Pulse 75 05/24/23 1311  Resp 22 05/24/23 1311  SpO2 98 % 05/24/23 1311  Vitals shown include unfiled device data.  Last Pain:  Vitals:   05/24/23 1309  TempSrc: Tympanic  PainSc: Asleep         Complications: No notable events documented.

## 2023-05-24 NOTE — Progress Notes (Signed)
 Patient requesting to have his sister Gaile Jourdain [phone] updated on plan of care and any procedures/surgeries that may occur. She is a Engineer, civil (consulting) in [place] .   Patient with limited health care literacy.   Password created and discussed with sister patient will be going for ERCP at 6. Ms. Moorpark Hammans requesting to have MD call her. Made aware.

## 2023-05-24 NOTE — Telephone Encounter (Signed)
 Patient Product/process development scientist completed.    The patient is insured through [facility] MEDICAID.     Ran test claim for Eliquis 5 mgand the current 30 day co-pay is $4.00.   This test claim was processed through [facility]- copay amounts may vary at other pharmacies due to pharmacy/plan contracts, or as the patient moves through the different stages of their insurance plan.     Roland Earl, CPHT Pharmacy Technician III Certified Patient Advocate [facility] Patient Advocate Team Direct Number: [phone]  Fax: [phone]

## 2023-05-24 NOTE — Plan of Care (Signed)

## 2023-05-24 NOTE — Progress Notes (Addendum)
 Progress Note    Todd Ware  ZOX:[id number] DOB: 08-29-1985  DOA: 05/22/2023 PCP: Patient, No Pcp Per      Brief Narrative:    Medical records reviewed and are as summarized below:  Todd Ware is a 38 y.o. male with medical history significant for obesity, polysubstance use disorder (including tobacco and opiates), who presented to the hospital with pleuritic chest pain.  No recent surgery or hospitalization.  No family history of blood clots.  He was recently released from prison about 2 months ago.  He is homeless and staying at different hospitals and friends houses.   He was found to have acute pulmonary embolism and choledocholithiasis.       Assessment/Plan:   Principal Problem:   Pulmonary embolism (HCC) Active Problems:   Choledocholithiasis   Tobacco abuse   Polysubstance abuse (HCC)    Body mass index is 33.91 kg/m.  (Obesity)   Acute pulmonary embolism, chest pain: Troponins were negative.  He is tolerating room air.  No hypotension. Analgesics as needed for pain. IV heparin drip has been held for ERCP today.  Plan to resume heparin drip after ERCP.   Cholelithiasis, choledocholithiasis: Plan for ERCP today. Consulted general surgeon, Dr. Cornel Diesel, for cholelithiasis and choledocholithiasis Mid common bile duct measures up to 8 mm.  Liver enzymes are normal.     Polysubstance use disorder, tobacco use disorder: Counseled to quit.  Continue nicotine patch   Homelessness: Consult TOC to assist with social needs and medications.   Diet Order             Diet NPO time specified  Diet effective midnight                            Consultants: None  Procedures: None    Medications:    nicotine  21 mg Transdermal Daily   Continuous Infusions:     Anti-infectives (From admission, onward)    None              Family Communication/Anticipated D/C date and plan/Code Status   DVT prophylaxis:       Code Status: Full Code  Family Communication: None Disposition Plan: Plan to discharge home   GEN: NAD SKIN: Warm and dry EYES: No pallor or icterus ENT: MMM CV: RRR PULM: CTA B ABD: soft, ND, NT, +BS CNS: AAO x 3, non focal EXT: No edema or tenderness        Subjective:   Interval events noted.  He reports pain in the right side of his chest.  No shortness of breath.  No hemoptysis.  Objective:    Vitals:   05/23/23 2028 05/23/23 2354 05/24/23 0512 05/24/23 0903  BP: (!) 148/83 138/86 (!) 146/88 136/84  Pulse: (!) 57 69 62 (!) 54  Resp: 18 16 18 18   Temp: 98.9 F (37.2 C) 98.1 F (36.7 C) 98.2 F (36.8 C) 98.3 F (36.8 C)  TempSrc:      SpO2: 99% 98% 98% 98%  Weight:      Height:       No data found.   Intake/Output Summary (Last 24 hours) at 05/24/2023 1046 Last data filed at 05/24/2023 0340 Gross per 24 hour  Intake 614.28 ml  Output 1900 ml  Net -1285.72 ml   Filed Weights   05/22/23 1214  Weight: 113.4 kg    Exam:  GEN: NAD SKIN: Warm and dry EYES:  No pallor or icterus ENT: MMM CV: RRR PULM: CTA B ABD: soft, obese, NT, +BS CNS: AAO x 3, non focal EXT: No edema or tenderness       Data Reviewed:   I have personally reviewed following labs and imaging studies:  Labs: Labs show the following:   Basic Metabolic Panel: Recent Labs  Lab 05/22/23 1350 05/23/23 0601  NA 137 136  K 3.6 3.8  CL 103 105  CO2 24 28  GLUCOSE 87 89  BUN 11 10  CREATININE 0.65 0.74  CALCIUM 8.7* 8.1*   GFR Estimated Creatinine Clearance: 167.5 mL/min (by C-G formula based on SCr of 0.74 mg/dL). Liver Function Tests: Recent Labs  Lab 05/22/23 1350 05/23/23 0601  AST 17 13*  ALT 11 10  ALKPHOS 58 56  BILITOT 0.8 0.5  PROT 6.6 6.2*  ALBUMIN 3.4* 3.1*   No results for input(s): "LIPASE", "AMYLASE" in the last 168 hours. No results for input(s): "AMMONIA" in the last 168 hours. Coagulation profile Recent Labs  Lab 05/22/23 1653   INR 1.0    CBC: Recent Labs  Lab 05/22/23 1251 05/23/23 0601 05/24/23 0143  WBC 4.3 3.9* 4.5  NEUTROABS 2.9  --   --   HGB 14.1 13.4 13.8  HCT 42.6 40.0 41.2  MCV 87.7 87.1 87.1  PLT 170 168 178   Cardiac Enzymes: No results for input(s): "CKTOTAL", "CKMB", "CKMBINDEX", "TROPONINI" in the last 168 hours. BNP (last 3 results) No results for input(s): "PROBNP" in the last 8760 hours. CBG: No results for input(s): "GLUCAP" in the last 168 hours. D-Dimer: No results for input(s): "DDIMER" in the last 72 hours. Hgb A1c: No results for input(s): "HGBA1C" in the last 72 hours. Lipid Profile: No results for input(s): "CHOL", "HDL", "LDLCALC", "TRIG", "CHOLHDL", "LDLDIRECT" in the last 72 hours. Thyroid function studies: No results for input(s): "TSH", "T4TOTAL", "T3FREE", "THYROIDAB" in the last 72 hours.  Invalid input(s): "FREET3" Anemia work up: No results for input(s): "VITAMINB12", "FOLATE", "FERRITIN", "TIBC", "IRON", "RETICCTPCT" in the last 72 hours. Sepsis Labs: Recent Labs  Lab 05/22/23 1251 05/23/23 0601 05/24/23 0143  WBC 4.3 3.9* 4.5    Microbiology No results found for this or any previous visit (from the past 240 hours).  Procedures and diagnostic studies:  MR ABDOMEN MRCP W WO CONTAST Result Date: 05/23/2023 CLINICAL DATA:  Right upper quadrant abdominal pain. Evaluate for choledocholithiasis. EXAM: MRI ABDOMEN WITHOUT AND WITH CONTRAST (INCLUDING MRCP) TECHNIQUE: Multiplanar multisequence MR imaging of the abdomen was performed both before and after the administration of intravenous contrast. Heavily T2-weighted images of the biliary and pancreatic ducts were obtained, and three-dimensional MRCP images were rendered by post processing. CONTRAST:  10mL GADAVIST GADOBUTROL 1 MMOL/ML IV SOLN COMPARISON:  None Available. FINDINGS: Lower chest: No acute findings. Hepatobiliary: No focal liver abnormality. Multiple stones identified within the gallbladder. The  largest stone is in the neck measuring 1.1 cm, image 17/9. Gallbladder appears decompressed. No significant gallbladder wall thickening or pericholecystic inflammation. Multiple stones identified within the mid and distal common bile duct which measure up to 9 mm. Mild fusiform dilatation of the common bile duct measures up to 8 mm at the level of the mid duct. Mild central biliary dilatation. Pancreas: No mass, inflammatory changes, or other parenchymal abnormality identified. Spleen:  Within normal limits in size and appearance. Adrenals/Urinary Tract: No masses identified. No evidence of hydronephrosis. Stomach/Bowel: The stomach appears normal. There is no dilated loops of large or small bowel. Vascular/Lymphatic:  No pathologically enlarged lymph nodes identified. No abdominal aortic aneurysm demonstrated. Other:  No ascites or focal fluid collections. Musculoskeletal: No suspicious bone lesions identified. IMPRESSION: 1. Cholelithiasis without secondary signs of acute cholecystitis. 2. Choledocholithiasis with mild fusiform dilatation of the common bile duct and mild central biliary dilatation. The mid common bile duct measures up to 8 mm. Electronically Signed   By: Kimberley Penman M.D.   On: 05/23/2023 03:57   MR 3D Recon At Scanner Result Date: 05/23/2023 CLINICAL DATA:  Right upper quadrant abdominal pain. Evaluate for choledocholithiasis. EXAM: MRI ABDOMEN WITHOUT AND WITH CONTRAST (INCLUDING MRCP) TECHNIQUE: Multiplanar multisequence MR imaging of the abdomen was performed both before and after the administration of intravenous contrast. Heavily T2-weighted images of the biliary and pancreatic ducts were obtained, and three-dimensional MRCP images were rendered by post processing. CONTRAST:  10mL GADAVIST GADOBUTROL 1 MMOL/ML IV SOLN COMPARISON:  None Available. FINDINGS: Lower chest: No acute findings. Hepatobiliary: No focal liver abnormality. Multiple stones identified within the gallbladder. The  largest stone is in the neck measuring 1.1 cm, image 17/9. Gallbladder appears decompressed. No significant gallbladder wall thickening or pericholecystic inflammation. Multiple stones identified within the mid and distal common bile duct which measure up to 9 mm. Mild fusiform dilatation of the common bile duct measures up to 8 mm at the level of the mid duct. Mild central biliary dilatation. Pancreas: No mass, inflammatory changes, or other parenchymal abnormality identified. Spleen:  Within normal limits in size and appearance. Adrenals/Urinary Tract: No masses identified. No evidence of hydronephrosis. Stomach/Bowel: The stomach appears normal. There is no dilated loops of large or small bowel. Vascular/Lymphatic: No pathologically enlarged lymph nodes identified. No abdominal aortic aneurysm demonstrated. Other:  No ascites or focal fluid collections. Musculoskeletal: No suspicious bone lesions identified. IMPRESSION: 1. Cholelithiasis without secondary signs of acute cholecystitis. 2. Choledocholithiasis with mild fusiform dilatation of the common bile duct and mild central biliary dilatation. The mid common bile duct measures up to 8 mm. Electronically Signed   By: Kimberley Penman M.D.   On: 05/23/2023 03:57   CT Head Wo Contrast Result Date: 05/22/2023 CLINICAL DATA:  Headache sudden, severe. EXAM: CT HEAD WITHOUT CONTRAST TECHNIQUE: Contiguous axial images were obtained from the base of the skull through the vertex without intravenous contrast. RADIATION DOSE REDUCTION: This exam was performed according to the departmental dose-optimization program which includes automated exposure control, adjustment of the mA and/or kV according to patient size and/or use of iterative reconstruction technique. COMPARISON:  None Available. FINDINGS: Brain: No acute infarct, hemorrhage, or mass lesion is present. No significant white matter lesions are present. Deep brain nuclei are within normal limits. The ventricles  are of normal size. No significant extraaxial fluid collection is present. The brainstem and cerebellum are within normal limits. Midline structures are within normal limits. Vascular: No hyperdense vessel or unexpected calcification. Skull: Calvarium is intact. No focal lytic or blastic lesions are present. Left supraorbital frontal scalp soft tissue swelling is present. No underlying fracture or foreign body is present. No other significant extracranial soft tissue injury is present. Sinuses/Orbits: A small polyp or mucous retention cyst is present posteriorly in the right sphenoid sinus. The paranasal sinuses and mastoid air cells are otherwise clear. The globes and orbits are within normal limits. IMPRESSION: 1. Left supraorbital frontal scalp soft tissue swelling without underlying fracture or foreign body. 2. Normal CT appearance of the brain. 3. Small polyp or mucous retention cyst posteriorly in the right sphenoid sinus. Electronically  Signed   By: Audree Leas M.D.   On: 05/22/2023 16:33   US  Venous Img Lower Unilateral Left Result Date: 05/22/2023 CLINICAL DATA:  Left lower extremity pain and swelling. EXAM: Left LOWER EXTREMITY VENOUS DOPPLER ULTRASOUND TECHNIQUE: Gray-scale sonography with compression, as well as color and duplex ultrasound, were performed to evaluate the deep venous system(s) from the level of the common femoral vein through the popliteal and proximal calf veins. COMPARISON:  None Available. FINDINGS: VENOUS Normal compressibility of the common femoral, superficial femoral, and popliteal veins, as well as the visualized calf veins. Visualized portions of profunda femoral vein and great saphenous vein unremarkable. No filling defects to suggest DVT on grayscale or color Doppler imaging. Doppler waveforms show normal direction of venous flow, normal respiratory plasticity and response to augmentation. Limited views of the contralateral common femoral vein are unremarkable.  OTHER None. Limitations: none IMPRESSION: Negative left lower extremity venous Doppler ultrasound.  No DVT. Electronically Signed   By: Audree Leas M.D.   On: 05/22/2023 16:28   CT Angio Chest PE W/Cm &/Or Wo Cm Result Date: 05/22/2023 CLINICAL DATA:  Right-sided chest pain.  Left-sided leg pain. EXAM: CT ANGIOGRAPHY CHEST WITH CONTRAST TECHNIQUE: Multidetector CT imaging of the chest was performed using the standard protocol during bolus administration of intravenous contrast. Multiplanar CT image reconstructions and MIPs were obtained to evaluate the vascular anatomy. RADIATION DOSE REDUCTION: This exam was performed according to the departmental dose-optimization program which includes automated exposure control, adjustment of the mA and/or kV according to patient size and/or use of iterative reconstruction technique. CONTRAST:  75mL OMNIPAQUE IOHEXOL 350 MG/ML SOLN COMPARISON:  09/01/2016 chest radiograph from [place]. No comparison CT. FINDINGS: Cardiovascular: The quality of this exam for evaluation of pulmonary embolism is sufficient. Isolated filling defect within the posterior right upper lobe segmental and subsegmental pulmonary artery branches, relatively occlusive including on 136/9 and coronal image 78. Normal aortic caliber. Mild cardiomegaly, without pericardial effusion. Mediastinum/Nodes: Borderline to mild right hilar adenopathy is likely reactive at 1.4 cm. No mediastinal or left hilar adenopathy. Lungs/Pleura: Trace right pleural fluid. Mild paraseptal and centrilobular emphysema. Posterior right upper lobe pleural-based airspace opacity including on 34/8. 3 mm pleural-based left upper lobe pulmonary nodule on 31/8. Upper Abdomen: Multiple small gallstones. Focal steatosis adjacent the falciform ligament. Normal imaged portions of the spleen, stomach, pancreas, adrenal glands, kidneys Although there is no significant common duct dilatation, choledocholithiasis is seen including  stones of up to 5 mm on 146/7 and coronal image 63. Musculoskeletal: No acute osseous abnormality. Minimal convex left thoracic spine curvature. Review of the MIP images confirms the above findings. IMPRESSION: 1. Posterior right upper lobe segmental to subsegmental pulmonary embolism, likely occlusive. Posterior right upper lobe infarct. 2. Trace right pleural fluid 3. Cholelithiasis. Choledocholithiasis without significant biliary duct dilatation. 4. Emphysema (ICD10-J43.9). left upper lobe 3 mm pulmonary nodule. No follow-up needed if patient is low-risk. Non-contrast chest CT can be considered in 12 months if patient is high-risk, nodule is upper lobe, and/or suspicious in morphology. This recommendation follows the consensus statement: Guidelines for Management of Incidental Pulmonary Nodules Detected on CT Images: From the Fleischner Society 2017; Radiology 2017; 284:228-243. Critical test results telephoned to . Dr. Cleora Daft. At the time of interpretation at . 3:45 p.m.On . 05/22/2023. Electronically Signed   By: Lore Rode M.D.   On: 05/22/2023 15:44               LOS: 1 day   Fabianna Keats  Atalie Oros  [employer] loss adjuster on [email or web address]. If 7PM-7AM, please contact night-coverage at [email or web address]     05/24/2023, 10:46 AM

## 2023-05-24 NOTE — Progress Notes (Addendum)
 PHARMACY - ANTICOAGULATION CONSULT NOTE  Pharmacy Consult for heparin drip Indication: pulmonary embolus  No Known Allergies  Patient Measurements: Height: 6' (182.9 cm) Weight: 113.4 kg (250 lb) IBW/kg (Calculated) : 77.6 HEPARIN DW (KG): 101.9  Vital Signs: Temp: 98.1 F (36.7 C) (04/27 2354) BP: 138/86 (04/27 2354) Pulse Rate: 69 (04/27 2354)  Labs: Recent Labs    05/22/23 1251 05/22/23 1350 05/22/23 1653 05/22/23 2348 05/23/23 0601 05/24/23 0143  HGB 14.1  --   --   --  13.4 13.8  HCT 42.6  --   --   --  40.0 41.2  PLT 170  --   --   --  168 178  APTT  --   --  32  --   --   --   LABPROT  --   --  13.5  --   --   --   INR  --   --  1.0  --   --   --   HEPARINUNFRC  --   --   --  0.49 0.54 0.53  CREATININE  --  0.65  --   --  0.74  --   TROPONINIHS 4 4  --   --   --   --     Estimated Creatinine Clearance: 167.5 mL/min (by C-G formula based on SCr of 0.74 mg/dL).   Medical History: Past Medical History:  Diagnosis Date   Seizures (HCC)     Medications:  No medications prior to admission.   Scheduled:   nicotine  21 mg Transdermal Daily   Infusions:   heparin 1,700 Units/hr (05/23/23 0617)    Assessment: 38 yo male to start heparin drip for PE. Hx: bipolar No anticoagulants PTA Baseline :  Hgb 14.1  Plt 170   aPTT 32   INR 1.0  Goal of Therapy:  Heparin level 0.3-0.7 units/ml Monitor platelets by anticoagulation protocol: Yes   Plan:  4/28:  HL @ 0143 = 0.53, therapeutic X 3 - heparin gtt to stop @ 0400 for procedure, will need to f/u with provider regarding plans for anticoag  Ryler Laskowski D Clinical Pharmacist 05/24/2023

## 2023-05-24 NOTE — Anesthesia Postprocedure Evaluation (Signed)
 Anesthesia Post Note  Patient: Todd Ware  Procedure(s) Performed: ERCP, WITH INTERVENTION IF INDICATED ERCP, WITH CHOLANGIOSCOPY AND LITHOTRIPSY  Patient location during evaluation: Endoscopy Anesthesia Type: General Level of consciousness: awake and alert Pain management: pain level controlled Vital Signs Assessment: post-procedure vital signs reviewed and stable Respiratory status: spontaneous breathing, nonlabored ventilation, respiratory function stable and patient connected to nasal cannula oxygen Cardiovascular status: blood pressure returned to baseline and stable Postop Assessment: no apparent nausea or vomiting Anesthetic complications: no   No notable events documented.   Last Vitals:  Vitals:   05/24/23 1319 05/24/23 1329  BP: 115/80 117/81  Pulse: 67 67  Resp: 18 16  Temp:    SpO2: 100% 99%    Last Pain:  Vitals:   05/24/23 1329  TempSrc:   PainSc: 0-No pain                 Portia Brittle Demoni Gergen

## 2023-05-24 NOTE — Anesthesia Preprocedure Evaluation (Signed)
 Anesthesia Evaluation  Patient identified by MRN, date of birth, ID band Patient awake    Reviewed: Allergy & Precautions, NPO status , Patient's Chart, lab work & pertinent test results  History of Anesthesia Complications Negative for: history of anesthetic complications  Airway Mallampati: II  TM Distance: >3 FB Neck ROM: Full    Dental no notable dental hx. (+) Teeth Intact   Pulmonary neg sleep apnea, neg COPD, Current Smoker and Patient abstained from smoking., PE Patient presented to hospital with pleuritic chest pain, found to have subsegmental PE. Not on supplemental oxygen, hemodynamically stable.   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 Denies heart surgeries or procedures. No murmurs appreciated.   Neuro/Psych Seizures -,  PSYCHIATRIC DISORDERS   Bipolar Disorder      GI/Hepatic ,neg GERD  ,,(+)     substance abuse  marijuana use+ ecstasy/molly use. None in past week. Incidentally found CBD stone.   Endo/Other  neg diabetes    Renal/GU negative Renal ROS     Musculoskeletal   Abdominal   Peds  Hematology   Anesthesia Other Findings Past Medical History: No date: Seizures (HCC)  Reproductive/Obstetrics                             Anesthesia Physical Anesthesia Plan  ASA: 2  Anesthesia Plan: General   Post-op Pain Management: Minimal or no pain anticipated   Induction: Intravenous  PONV Risk Score and Plan: 1 and Propofol infusion, TIVA, Ondansetron and Midazolam  Airway Management Planned: Nasal Cannula  Additional Equipment: None  Intra-op Plan:   Post-operative Plan:    Informed Consent: I have reviewed the patients History and Physical, chart, labs and discussed the procedure including the risks, benefits and alternatives for the proposed anesthesia with the patient or authorized representative who has indicated his/her understanding and acceptance.     Dental advisory given  Plan Discussed with: CRNA and Surgeon  Anesthesia Plan Comments: (Discussed risks of anesthesia with patient, including possibility of difficulty with spontaneous ventilation under anesthesia necessitating airway intervention, PONV, and rare risks such as cardiac or respiratory or neurological events, and allergic reactions. Discussed the role of CRNA in patient's perioperative care. Patient understands.)       Anesthesia Quick Evaluation

## 2023-05-24 NOTE — Progress Notes (Signed)
 Reached out to patient's parole officer, per request, as he is missing a parole appointment today. Will send note to [employer].

## 2023-05-24 NOTE — Op Note (Signed)
 [facility] Gastroenterology Patient Name: Todd Ware Procedure Date: 05/24/2023 12:43 PM MRN: [id number] Account #: [id number] Date of Birth: March 12, 1985 Admit Type: Inpatient Age: 38 Room: [facility] ENDO ROOM 4 Gender: Male Note Status: Finalized Instrument Name: Todd Ware [id number] Procedure:             ERCP Indications:           Common bile duct stone(s) Providers:             Marnee Sink MD, MD Medicines:             Propofol per Anesthesia Complications:         No immediate complications. Procedure:             Pre-Anesthesia Assessment:                        - Prior to the procedure, a History and Physical was                         performed, and patient medications and allergies were                         reviewed. The patient's tolerance of previous                         anesthesia was also reviewed. The risks and benefits                         of the procedure and the sedation options and risks                         were discussed with the patient. All questions were                         answered, and informed consent was obtained. Prior                         Anticoagulants: The patient has taken Lovenox                         (enoxaparin). ASA Grade Assessment: II - A patient                         with mild systemic disease. After reviewing the risks                         and benefits, the patient was deemed in satisfactory                         condition to undergo the procedure.                        After obtaining informed consent, the scope was passed                         under direct vision. Throughout the procedure, the                         patient's blood pressure, pulse, and oxygen  saturations were monitored continuously. The                         Duodenoscope was introduced through the mouth, and                         used to inject contrast into and used to cannulate the                          bile duct. The ERCP was accomplished without                         difficulty. The patient tolerated the procedure well. Findings:      A scout film of the abdomen was obtained. Multiple stones were seen in       the Gallbladder. The esophagus was successfully intubated under direct       vision. The scope was advanced to a normal major papilla in the       descending duodenum without detailed examination of the pharynx, larynx       and associated structures, and upper GI tract. The upper GI tract was       grossly normal. The bile duct was deeply cannulated with the short-nosed       traction sphincterotome. Contrast was injected. I personally interpreted       the bile duct images. There was brisk flow of contrast through the       ducts. Image quality was excellent. Contrast extended to the entire       biliary tree. The lower third of the main bile duct contained three       stones. A wire was passed into the biliary tree. A 7 mm biliary       sphincterotomy was made with a traction (standard) sphincterotome using       ERBE electrocautery. There was no post-sphincterotomy bleeding. The       biliary tree was swept with a 15 mm balloon starting at the bifurcation.       Three stones were removed. No stones remained. Impression:            - Choledocholithiasis was found. Complete removal was                         accomplished by biliary sphincterotomy and balloon                         extraction.                        - A biliary sphincterotomy was performed.                        - The biliary tree was swept. Recommendation:        - Return patient to hospital ward for ongoing care.                        - Clear liquid diet.                        - Watch for pancreatitis, bleeding, perforation, and  cholangitis. Procedure Code(s):     --- Professional ---                        [id number], Endoscopic retrograde cholangiopancreatography                          (ERCP); with removal of calculi/debris from                         biliary/pancreatic duct(s)                        43262, Endoscopic retrograde cholangiopancreatography                         (ERCP); with sphincterotomy/papillotomy                        [phone], Endoscopic catheterization of the biliary                         ductal system, radiological supervision and                         interpretation Diagnosis Code(s):     --- Professional ---                        K80.50, Calculus of bile duct without cholangitis or                         cholecystitis without obstruction CPT copyright 2022 American Medical Association. All rights reserved. The codes documented in this report are preliminary and upon coder review may  be revised to meet current compliance requirements. Marnee Sink MD, MD 05/24/2023 1:02:35 PM This report has been signed electronically. Number of Addenda: 0 Note Initiated On: 05/24/2023 12:43 PM Estimated Blood Loss:  Estimated blood loss: none.      [facility]

## 2023-05-24 NOTE — Consult Note (Signed)
 Patient ID: Todd Ware, male   DOB: [birth year], 38 y.o.   MRN: [id number] CC: Choledocholithiasis History of Present Illness Todd Ware is a 38 y.o. male with past medical history of seizures and polysubstance abuse who presents in consultation for choledocholithiasis.  The patient reports that on Friday night he started to develop pleuritic chest pain, headache and shortness of breath.  He reports that it felt like he was having a heart attack and he also had pain on his right chest.  He reports that anytime he took a deep breath he was in a lot of pain but it did not go away like he usually does.  For this reason he came to the emergency department.  Here he had a CTA of his chest that showed a pulmonary embolism.  He on the inferior cuts of the CTA it was noted that there was cholelithiasis with some biliary ductal dilation.  At that time, however his bilirubin was within normal limits.  An MRI was obtained that showed a mild dilation of his common bile duct so he was taken for a ERCP today.  He is seen post procedure ERCP.  He reports that he has some abdominal pain.  He says that his shortness of breath and pleuritic chest pain has improved.  He denies any previous postprandial pain, jaundice.  He does say that sometimes he will get chest pain and epigastric pain but attributed that to when he took ecstasy.   Past Medical History Past Medical History:  Diagnosis Date   Seizures [facility])        Past Surgical History:  Procedure Laterality Date   ERCP N/A 05/24/2023   Procedure: ERCP, WITH INTERVENTION IF INDICATED;  Surgeon: Marnee Sink, MD;  Location: [facility] ENDOSCOPY;  Service: Endoscopy;  Laterality: N/A;   FACIAL FRACTURE SURGERY     SPYGLASS LITHOTRIPSY  05/24/2023   Procedure: ERCP, WITH CHOLANGIOSCOPY AND LITHOTRIPSY;  Surgeon: Marnee Sink, MD;  Location: [facility] ENDOSCOPY;  Service: Endoscopy;;    No Known Allergies  Current Facility-Administered Medications  Medication Dose  Route Frequency Provider Last Rate Last Admin   acetaminophen  (TYLENOL ) tablet 650 mg  650 mg Oral Q6H PRN Merrill, Kristin A, RPH       heparin ADULT infusion 100 units/mL (25000 units/250mL)  1,700 Units/hr Intravenous Continuous Merrill, Kristin A, RPH       nicotine (NICODERM CQ - dosed in mg/24 hours) patch 21 mg  21 mg Transdermal Daily Marnee Sink, MD   21 mg at 05/23/23 0832   ondansetron (ZOFRAN) tablet 4 mg  4 mg Oral Q6H PRN Marnee Sink, MD       Or   ondansetron (ZOFRAN) injection 4 mg  4 mg Intravenous Q6H PRN Marnee Sink, MD   4 mg at 05/24/23 1531   Oral care mouth rinse  15 mL Mouth Rinse PRN Marnee Sink, MD       oxyCODONE  (Oxy IR/ROXICODONE ) immediate release tablet 5 mg  5 mg Oral Q6H PRN Marnee Sink, MD   5 mg at 05/23/23 1430    Family History History reviewed. No pertinent family history.     Social History Social History   Tobacco Use   Smoking status: Every Day    Types: Cigarettes   Smokeless tobacco: Never  Substance Use Topics   Alcohol use: No   Drug use: Yes    Types: Marijuana    Comment: molly last week        ROS Full  ROS of systems performed and is otherwise negative there than what is stated in the HPI  Physical Exam Blood pressure 117/82, pulse (!) 51, temperature 98.2 F (36.8 C), resp. rate 18, height 6' (1.829 m), weight 113.4 kg, SpO2 97%.  Patient is alert and oriented x 3, normal work of breathing on room air, sinus bradycardia, abdomen is soft, mildly distended with some tenderness in the epigastrium and some tenderness in the right upper quadrant though no Murphy sign or rebound tenderness.,  Moving all extremities spontaneously Data Reviewed I reviewed his CTA there are stones in the gallbladder lumen and looks like there may have been a stone distally.  His labs were significant for normal to the bilirubin.  He has not had a leukocytosis.  I reviewed his ERCP cholangiogram and there did seem to be some spots of filling defects.   There was expulsion of some stones when there was a sphincterotomy made per the ERCP report.  I have personally reviewed the patient's imaging and medical records.    Assessment/Plan    38 year old who presented to the hospital with pleuritic chest pain and was found to have some gallstones with mildly dilated common bile duct.  He was taken for an ERCP and had a sphincterotomy today.  He does not have any signs of cholecystitis and his white count is normal.  He also was found to have a pulmonary embolism on exam.  He is on a heparin drip.  At this time I think it is more prudent to try to wait for cholecystectomy when we can better pause his anticoagulation.  Given that he has no evidence of cholecystitis I do not think antibiotics are indicated.  Would diet today and advance as tolerated.  If he starts showing signs of cholecystitis then we may need to reconsider but at this time to restart anticoagulation per primary team.  I discussed with him that we will try to wait 4 to 6 weeks when it is more appropriate to stop his anticoagulation for surgery.  A total of 60 minutes was spent reviewing the patient's chart performing a history and physical and discussing treatment options with the patien   Todd Ware 05/24/2023, 4:37 PM

## 2023-05-24 NOTE — Progress Notes (Signed)
 PHARMACY - ANTICOAGULATION CONSULT NOTE  Pharmacy Consult for heparin drip Indication: pulmonary embolus  No Known Allergies  Patient Measurements: Height: 6' (182.9 cm) Weight: 113.4 kg (250 lb) IBW/kg (Calculated) : 77.6 HEPARIN DW (KG): 101.9  Vital Signs: Temp: 98.2 F (36.8 C) (04/28 1440) Temp Source: Tympanic (04/28 1309) BP: 117/82 (04/28 1440) Pulse Rate: 51 (04/28 1440)  Labs: Recent Labs    05/22/23 1251 05/22/23 1350 05/22/23 1653 05/22/23 2348 05/23/23 0601 05/24/23 0143  HGB 14.1  --   --   --  13.4 13.8  HCT 42.6  --   --   --  40.0 41.2  PLT 170  --   --   --  168 178  APTT  --   --  32  --   --   --   LABPROT  --   --  13.5  --   --   --   INR  --   --  1.0  --   --   --   HEPARINUNFRC  --   --   --  0.49 0.54 0.53  CREATININE  --  0.65  --   --  0.74  --   TROPONINIHS 4 4  --   --   --   --     Estimated Creatinine Clearance: 167.5 mL/min (by C-G formula based on SCr of 0.74 mg/dL).   Medical History: Past Medical History:  Diagnosis Date   Seizures (HCC)     Medications:  No medications prior to admission.   Scheduled:   nicotine  21 mg Transdermal Daily   Infusions:   heparin      Assessment: 38 yo male to start heparin drip for PE. Hx: bipolar No anticoagulants PTA Baseline :  Hgb 14.1  Plt 170   aPTT 32   INR 1.0  4/28:  HL @ 0143 = 0.53, therapeutic X 3  Goal of Therapy:  Heparin level 0.3-0.7 units/ml Monitor platelets by anticoagulation protocol: Yes   Plan:  - heparin gtt stopped @ 0400 for procedure -will now restart heparin drip at previous rate of 1700 unit/hr -check HL 6 hours after drip resumed -check CBC daily  Thomasine Flick PharmD Clinical Pharmacist 05/24/2023

## 2023-05-25 ENCOUNTER — Other Ambulatory Visit: Payer: Self-pay

## 2023-05-25 DIAGNOSIS — I2699 Other pulmonary embolism without acute cor pulmonale: Secondary | ICD-10-CM | POA: Diagnosis not present

## 2023-05-25 DIAGNOSIS — K8050 Calculus of bile duct without cholangitis or cholecystitis without obstruction: Secondary | ICD-10-CM | POA: Diagnosis not present

## 2023-05-25 DIAGNOSIS — F4310 Post-traumatic stress disorder, unspecified: Secondary | ICD-10-CM | POA: Diagnosis not present

## 2023-05-25 DIAGNOSIS — K8070 Calculus of gallbladder and bile duct without cholecystitis without obstruction: Secondary | ICD-10-CM | POA: Diagnosis not present

## 2023-05-25 LAB — CBC
HCT: 41.9 % (ref 39.0–52.0)
Hemoglobin: 14.3 g/dL (ref 13.0–17.0)
MCH: 29.8 pg (ref 26.0–34.0)
MCHC: 34.1 g/dL (ref 30.0–36.0)
MCV: 87.3 fL (ref 80.0–100.0)
Platelets: 174 10*3/uL (ref 150–400)
RBC: 4.8 MIL/uL (ref 4.22–5.81)
RDW: 13.4 % (ref 11.5–15.5)
WBC: 3.6 10*3/uL — ABNORMAL LOW (ref 4.0–10.5)
nRBC: 0 % (ref 0.0–0.2)

## 2023-05-25 LAB — COMPREHENSIVE METABOLIC PANEL WITH GFR
ALT: 15 U/L (ref 0–44)
AST: 20 U/L (ref 15–41)
Albumin: 3.6 g/dL (ref 3.5–5.0)
Alkaline Phosphatase: 67 U/L (ref 38–126)
Anion gap: 7 (ref 5–15)
BUN: 8 mg/dL (ref 6–20)
CO2: 28 mmol/L (ref 22–32)
Calcium: 9 mg/dL (ref 8.9–10.3)
Chloride: 102 mmol/L (ref 98–111)
Creatinine, Ser: 0.84 mg/dL (ref 0.61–1.24)
GFR, Estimated: >60 mL/min (ref >60)
Glucose, Bld: 96 mg/dL (ref 70–99)
Potassium: 4 mmol/L (ref 3.5–5.1)
Sodium: 137 mmol/L (ref 135–145)
Total Bilirubin: 0.7 mg/dL (ref 0.0–1.2)
Total Protein: 6.9 g/dL (ref 6.5–8.1)

## 2023-05-25 LAB — HEPARIN LEVEL (UNFRACTIONATED): Heparin Unfractionated: 0.54 [IU]/mL (ref 0.30–0.70)

## 2023-05-25 LAB — LIPASE, BLOOD: Lipase: 26 U/L (ref 11–51)

## 2023-05-25 MED ORDER — ALPRAZOLAM 0.5 MG PO TABS
0.5000 mg | ORAL_TABLET | Freq: Two times a day (BID) | ORAL | Status: DC | PRN
  Administered 2023-05-25: 0.5 mg via ORAL
  Filled 2023-05-25: qty 1

## 2023-05-25 MED ORDER — PRAZOSIN HCL 1 MG PO CAPS
1.0000 mg | ORAL_CAPSULE | Freq: Every day | ORAL | 0 refills | Status: AC
  Filled 2023-05-25: qty 30, 30d supply, fill #0

## 2023-05-25 MED ORDER — SERTRALINE HCL 50 MG PO TABS
25.0000 mg | ORAL_TABLET | Freq: Every day | ORAL | Status: DC
  Administered 2023-05-25: 25 mg via ORAL
  Filled 2023-05-25: qty 1

## 2023-05-25 MED ORDER — APIXABAN (ELIQUIS) VTE STARTER PACK (10MG AND 5MG)
ORAL_TABLET | ORAL | 0 refills | Status: DC
  Filled 2023-05-25: qty 74, 30d supply, fill #0

## 2023-05-25 MED ORDER — APIXABAN 5 MG PO TABS: 5.0000 mg | ORAL_TABLET | Freq: Two times a day (BID) | ORAL | Status: DC

## 2023-05-25 MED ORDER — SERTRALINE HCL 25 MG PO TABS
25.0000 mg | ORAL_TABLET | Freq: Every day | ORAL | 0 refills | Status: DC
  Filled 2023-05-25: qty 30, 30d supply, fill #0

## 2023-05-25 MED ORDER — NICOTINE 21 MG/24HR TD PT24
21.0000 mg | MEDICATED_PATCH | Freq: Every day | TRANSDERMAL | 0 refills | Status: AC
  Filled 2023-05-25: qty 14, 14d supply, fill #0

## 2023-05-25 MED ORDER — PRAZOSIN HCL 1 MG PO CAPS: 1.0000 mg | ORAL_CAPSULE | Freq: Every day | ORAL | Status: DC

## 2023-05-25 MED ORDER — APIXABAN 5 MG PO TABS
10.0000 mg | ORAL_TABLET | Freq: Two times a day (BID) | ORAL | Status: DC
  Administered 2023-05-25: 10 mg via ORAL
  Filled 2023-05-25: qty 2

## 2023-05-25 MED ORDER — OXYCODONE HCL 5 MG PO TABS
5.0000 mg | ORAL_TABLET | Freq: Two times a day (BID) | ORAL | 0 refills | Status: DC | PRN
  Filled 2023-05-25: qty 6, 3d supply, fill #0

## 2023-05-25 NOTE — Progress Notes (Signed)
    Marnee Sink, MD [facility]   [address] [place], [place] [address] Phone: [phone] Fax : [phone]   Subjective: The patient has no complaints today.  The patient an ERCP with 3 stones extracted yesterday.  He also had a sphincterotomy at that time.  The patient's heparin was restarted and his hemoglobin has been stable without any sign of bleeding.   Objective: Vital signs in last 24 hours: Vitals:   05/24/23 1329 05/24/23 1440 05/24/23 2132 05/25/23 0456  BP: 117/81 117/82 109/77 101/66  Pulse: 67 (!) 51 (!) 56 (!) 41  Resp: 16 18 17 16   Temp:  98.2 F (36.8 C) 98.4 F (36.9 C) 97.6 F (36.4 C)  TempSrc:      SpO2: 99% 97% 97% 98%  Weight:      Height:       Weight change:   Intake/Output Summary (Last 24 hours) at 05/25/2023 0730 Last data filed at 05/25/2023 1308 Gross per 24 hour  Intake 446.38 ml  Output 650 ml  Net -203.62 ml     Exam: Heart:: Regular rate and rhythm or without murmur or extra heart sounds Lungs: normal and clear to auscultation and percussion Abdomen: soft, nontender, normal bowel sounds   Lab Results: @LABTEST2 @ Micro Results: No results found for this or any previous visit (from the past 240 hours). Studies/Results: DG C-Arm 1-60 Min-No Report Result Date: 05/24/2023 Fluoroscopy was utilized by the requesting physician.  No radiographic interpretation.   Medications: I have reviewed the patient's current medications. Scheduled Meds:  nicotine  21 mg Transdermal Daily   Continuous Infusions:  heparin 1,700 Units/hr (05/25/23 0223)   PRN Meds:.acetaminophen , ALPRAZolam, ondansetron **OR** ondansetron (ZOFRAN) IV, mouth rinse, oxyCODONE    Assessment: Principal Problem:   Pulmonary embolism (HCC) Active Problems:   Choledocholithiasis   Tobacco abuse   Polysubstance abuse (HCC)    Plan: This patient is status post ERCP with sphincterotomy and stone extraction yesterday without incident.  The patient is  doing well without any postprocedural issues.  I will sign off.  Please call if any further GI concerns or questions.  We would like to thank you for the opportunity to participate in the care of Todd Ware.    LOS: 2 days   Marnee Sink, MD.FACG 05/25/2023, 7:30 AM Pager [phone] 7am-5pm  Check AMION for 5pm -7am coverage and on weekends

## 2023-05-25 NOTE — Progress Notes (Signed)
 PHARMACY - ANTICOAGULATION CONSULT NOTE  Pharmacy Consult for heparin drip Indication: pulmonary embolus  No Known Allergies  Patient Measurements: Height: 6' (182.9 cm) Weight: 113.4 kg (250 lb) IBW/kg (Calculated) : 77.6 HEPARIN DW (KG): 101.9  Vital Signs: Temp: 97.6 F (36.4 C) (04/29 0456) BP: 101/66 (04/29 0456) Pulse Rate: 41 (04/29 0456)  Labs: Recent Labs    05/22/23 1251 05/22/23 1350 05/22/23 1653 05/22/23 2348 05/23/23 0601 05/24/23 0143 05/24/23 2321 05/25/23 0546  HGB 14.1  --   --   --  13.4 13.8  --  14.3  HCT 42.6  --   --   --  40.0 41.2  --  41.9  PLT 170  --   --   --  168 178  --  174  APTT  --   --  32  --   --   --   --   --   LABPROT  --   --  13.5  --   --   --   --   --   INR  --   --  1.0  --   --   --   --   --   HEPARINUNFRC  --   --   --    < > 0.54 0.53 0.47 0.54  CREATININE  --  0.65  --   --  0.74  --   --   --   TROPONINIHS 4 4  --   --   --   --   --   --    < > = values in this interval not displayed.    Estimated Creatinine Clearance: 167.5 mL/min (by C-G formula based on SCr of 0.74 mg/dL).   Medical History: Past Medical History:  Diagnosis Date   Seizures (HCC)     Medications:  No medications prior to admission.   Scheduled:   nicotine  21 mg Transdermal Daily   Infusions:   heparin 1,700 Units/hr (05/25/23 0223)    Assessment: 38 yo male to start heparin drip for PE. Hx: bipolar No anticoagulants PTA Baseline :  Hgb 14.1  Plt 170   aPTT 32   INR 1.0  4/28:  HL @ 0143 = 0.53, therapeutic X 3 4/28 2321 HL 0.47 therapeutic x 1 4/29 0546 HL 0.54, therapeutic x 2  Goal of Therapy:  Heparin level 0.3-0.7 units/ml Monitor platelets by anticoagulation protocol: Yes   Plan:  -Continue heparin drip at rate of 1700 unit/hr -check HL daily w/ AM labs while therapeutic -check CBC daily  Coretta Dexter, PharmD, [facility] 05/25/2023 6:38 AM

## 2023-05-25 NOTE — Progress Notes (Signed)
 PHARMACY - ANTICOAGULATION CONSULT NOTE  Pharmacy Consult for apixaban Indication: pulmonary embolus  No Known Allergies  Patient Measurements: Height: 6' (182.9 cm) Weight: 113.4 kg (250 lb) IBW/kg (Calculated) : 77.6 HEPARIN DW (KG): 101.9  Vital Signs: Temp: 97.6 F (36.4 C) (04/29 0456) BP: 101/66 (04/29 0456) Pulse Rate: 41 (04/29 0456)  Labs: Recent Labs    05/22/23 1251 05/22/23 1350 05/22/23 1653 05/22/23 2348 05/23/23 0601 05/24/23 0143 05/24/23 2321 05/25/23 0546  HGB 14.1  --   --   --  13.4 13.8  --  14.3  HCT 42.6  --   --   --  40.0 41.2  --  41.9  PLT 170  --   --   --  168 178  --  174  APTT  --   --  32  --   --   --   --   --   LABPROT  --   --  13.5  --   --   --   --   --   INR  --   --  1.0  --   --   --   --   --   HEPARINUNFRC  --   --   --    < > 0.54 0.53 0.47 0.54  CREATININE  --  0.65  --   --  0.74  --   --   --   TROPONINIHS 4 4  --   --   --   --   --   --    < > = values in this interval not displayed.    Estimated Creatinine Clearance: 167.5 mL/min (by C-G formula based on SCr of 0.74 mg/dL).   Medical History: Past Medical History:  Diagnosis Date   Seizures (HCC)     Medications:  No medications prior to admission.   Scheduled:   apixaban  10 mg Oral BID   Followed by   Cecily Cohen ON 06/01/2023] apixaban  5 mg Oral BID   nicotine  21 mg Transdermal Daily   Infusions:     Assessment: 38 yo male to transition from heparin drip to apixaban for PE. Hx: bipolar No anticoagulants PTA Hgb 14.3  Plt 174 S/p ERCP 4/28 with sphincterotomy and stone extraction   Goal of Therapy:  Heparin level 0.3-0.7 units/ml Monitor platelets by anticoagulation protocol: Yes   Plan:  -Will order therapeutic apixaban dosing for PE: 10mg  po bid x 7 days then 5 mg po bid  -CBC/Scr per protocol  Thomasine Flick PharmD Clinical Pharmacist 05/25/2023

## 2023-05-25 NOTE — Progress Notes (Signed)
       CROSS COVER NOTE  NAME: Todd Ware MRN: [id number] DOB : [birth year]    Concern as stated by nurse / staff   pt here for chest pain, CT revealed PE, currently on heparin drip 17ml/hr. had ERCP and sphincterotomy performed today r/t gallstones. 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 we don't have any open beds on the unit. he's asking for something for his anxiety or he wants to leave AMA. he has a hx of bipolar and substance abuse      Pertinent findings on chart review:   Assessment and  Interventions   Assessment:  Anxiety related to rooming situation--threatening to sign out AMA  Plan: Ask administrative coordinator [facility]) if they can accommodate a private room for patient to prevent him signing out AMA Xanax as needed X

## 2023-05-25 NOTE — TOC Transition Note (Signed)
 Transition of Care [facility]) - Discharge Note   Patient Details  Name: DALY EOFF MRN: [id number] Date of Birth: 21-Apr-1985  [facility]) CM/SW Contact:  Elsie Halo, RN Phone Number: 05/25/2023, 2:15 PM   Clinical Narrative:    Patient is medically clear for discharge. He is going to stay with his friend at [facility]. TOC provided a taxi voucher to transport patient. Resources for housing, food, medication, and PCP resources added to the AVS. No other TOC needs identified.   Final next level of care: Other (comment) ([facility] w/ a friend) Barriers to Discharge: Continued Medical Work up   Patient Goals and CMS Choice            Discharge Placement                       Discharge Plan and Services Additional resources added to the After Visit Summary for                                       Social Drivers of Health (SDOH) Interventions SDOH Screenings   Food Insecurity: Food Insecurity Present (05/22/2023)  Housing: High Risk (05/22/2023)  Transportation Needs: Unmet Transportation Needs (05/22/2023)  Utilities: Not At Risk (05/22/2023)  Alcohol Screen: Medium Risk (08/10/2018)  Tobacco Use: High Risk (05/22/2023)     Readmission Risk Interventions     No data to display

## 2023-05-25 NOTE — TOC Progression Note (Signed)
 Transition of Care [facility]) - Progression Note    Patient Details  Name: Todd Ware MRN: [id number] Date of Birth: [birth year]  [facility]) CM/SW Contact  Elsie Halo, RN Phone Number: 05/25/2023, 11:34 AM  Clinical Narrative:     TOC spoke with the patient and reiterated the importance of establishing a PCP. TOC added  a list of PCP's [place] to the patients AVS.   TOC will continue to follow for dc planning.       Expected Discharge Plan and Services                                               Social Determinants of Health (SDOH) Interventions SDOH Screenings   Food Insecurity: Food Insecurity Present (05/22/2023)  Housing: High Risk (05/22/2023)  Transportation Needs: Unmet Transportation Needs (05/22/2023)  Utilities: Not At Risk (05/22/2023)  Alcohol Screen: Medium Risk (08/10/2018)  Tobacco Use: High Risk (05/22/2023)    Readmission Risk Interventions     No data to display

## 2023-05-25 NOTE — Progress Notes (Addendum)
 [facility] SURGICAL PROGRESS NOTE (cpt [phone])  Hospital Day(s): 2.   Interval History: Patient seen and examined, no acute events or new complaints overnight. Patient reports he is doing well; no significant abdominal pain. No chest pain. Some OSB with deep inspiration. Leukopenic with WBC to 3.6K this AM. Hgb to 14.3. Otherwise, no new labs. He did undergo ERCP on 04/28. He is on CLD; no issues  Review of Systems:  Constitutional: denies fever, chills  HEENT: denies cough or congestion  Respiratory: denies cough; = SOB (w/ deep inspiration only) Cardiovascular: denies chest pain or palpitations  Gastrointestinal: denies abdominal pain, N/V Genitourinary: denies burning with urination or urinary frequency Musculoskeletal: denies pain, decreased motor or sensation  Vital signs in last 24 hours: [min-max] current  Temp:  [97.6 F (36.4 C)-98.9 F (37.2 C)] 97.6 F (36.4 C) (04/29 0456) Pulse Rate:  [41-75] 41 (04/29 0456) Resp:  [16-20] 16 (04/29 0456) BP: (101-136)/(66-84) 101/66 (04/29 0456) SpO2:  [97 %-100 %] 98 % (04/29 0456)     Height: 6' (182.9 cm) Weight: 113.4 kg BMI (Calculated): 33.9   Intake/Output last 2 shifts:  04/28 0701 - 04/29 0700 In: 446.4 [I.V.:446.4] Out: 650 [Urine:650]   Physical Exam:  Constitutional: alert, cooperative and no distress  HENT: normocephalic without obvious abnormality  Eyes: PERRL, EOM's grossly intact and symmetric  Respiratory: breathing non-labored at rest  Cardiovascular: regular rate and sinus rhythm  Gastrointestinal: soft, non-tender, and non-distended Musculoskeletal: no edema or wounds, motor and sensation grossly intact, NT    Labs:     Latest Ref Rng & Units 05/25/2023    5:46 AM 05/24/2023    1:43 AM 05/23/2023    6:01 AM  CBC  WBC 4.0 - 10.5 K/uL 3.6  4.5  3.9   Hemoglobin 13.0 - 17.0 g/dL 60.4  54.0  98.1   Hematocrit 39.0 - 52.0 % 41.9  41.2  40.0   Platelets 150 - 400 K/uL 174  178  168        Latest Ref Rng & Units 05/23/2023    6:01 AM 05/22/2023    1:50 PM 08/10/2018   10:57 AM  CMP  Glucose 70 - 99 mg/dL 89  87  191   BUN 6 - 20 mg/dL 10  11  8    Creatinine 0.61 - 1.24 mg/dL 4.78  2.95  6.21   Sodium 135 - 145 mmol/L 136  137  139   Potassium 3.5 - 5.1 mmol/L 3.8  3.6  3.6   Chloride 98 - 111 mmol/L 105  103  104   CO2 22 - 32 mmol/L 28  24  27    Calcium 8.9 - 10.3 mg/dL 8.1  8.7  9.0   Total Protein 6.5 - 8.1 g/dL 6.2  6.6  6.4   Total Bilirubin 0.0 - 1.2 mg/dL 0.5  0.8  1.0   Alkaline Phos 38 - 126 U/L 56  58  47   AST 15 - 41 U/L 13  17  19    ALT 0 - 44 U/L 10  11  11       Imaging studies: No new pertinent imaging studies   Assessment/Plan:  38 y.o. male with choledocholithiasis s/p ERCP on 04/28, complicated by acute PE   - Again, in the setting of choledocholithiasis, we would recommend cholecystectomy; however, he is with acute PE which complicates things. He has NO evidence of cholecystitis on examination and without leukocytosis. For now, we will prioritize anticoagulation and  treatment of PE. In 4-6 weeks, we can see him as outpatient and reassess for interval cholecystectomy.    - Okay to advance diet as tolerated; Benefit from low fat diet to limit recurrence   - Further management per primary service  All of the above findings and recommendations were discussed with the patient, and the medical team, and all of patient's questions were answered to his expressed satisfaction.  -- Todd Bay, PA-C [facility] 05/25/2023, 7:18 AM M-F: 7am - 4pm

## 2023-05-25 NOTE — Discharge Summary (Signed)
 Physician Discharge Summary   Patient: Todd Ware MRN: [id number] DOB: [birth year]  Admit date:     05/22/2023  Discharge date: 05/25/23  Discharge Physician: Sheril Dines   PCP: Patient, No Pcp Per   Recommendations at discharge:   Follow-up with [facility] behavioral health within 2 weeks of discharge Follow-up with general surgeon, Dr. Cornel Diesel, in 1 month  Follow-up with PCP as scheduled Ambulatory referral placed to [facility] hematology for follow-up of pulmonary embolism  Discharge Diagnoses: Principal Problem:   Pulmonary embolism [facility]) Active Problems:   Choledocholithiasis   Tobacco abuse   Polysubstance abuse (HCC)  Resolved Problems:   * No resolved hospital problems. *  Hospital Course:  [name] is a 38 y.o. male with medical history significant for obesity, polysubstance use disorder (including tobacco and opiates), who presented to the hospital with pleuritic chest pain.  No recent surgery or hospitalization.  No family history of blood clots.  He was recently released from prison about 2 months ago.  He is homeless and staying at different hospitals and friends houses.     He was found to have acute pulmonary embolism and choledocholithiasis.   Assessment and Plan:   Acute pulmonary embolism, chest pain: Troponins were negative.  He is tolerating room air.  No hypotension. Analgesics as needed for pain. He will be discharged on Eliquis.  Eliquis starter pack prescription was provided.  The importance of medical adherence was reiterated.  He has been advised to obtain refills prior to running out of Eliquis since interruption in treatment may increase risk of recurrent pulmonary embolism. Recommended outpatient follow-up with PCP and hematologist.    Cholelithiasis, choledocholithiasis: S/p ERCP with biliary sphincterectomy and balloon extraction on 05/24/2023 He was evaluated by general surgeon.  Outpatient follow-up recommended for for elective  cholecystectomy. Mid common bile duct measures up to 8 mm. Lipase and repeat liver enzymes were normal post ERCP.    Polysubstance use disorder, tobacco use disorder: Counseled to quit.  Continue nicotine patch     Anxiety, PTSD: Consulted psychiatrist, Dr. Jadapelle.  She recommended treatment with Zoloft 25 mg daily and prazosin 1 mg nightly.  Outpatient follow-up at [facility] behavioral unit recommended.   Homelessness: He is going to stay with his friend at a Dentist.  Discharge medications were delivered to his bedside prior to discharge    His condition has improved and is deemed stable for discharge home today.      Pain control - [place]  Controlled Substance Reporting System database was reviewed. and patient was instructed, not to drive, operate heavy machinery, perform activities at heights, swimming or participation in water activities or provide baby-sitting services while on Pain, Sleep and Anxiety Medications; until their outpatient Physician has advised to do so again. Also recommended to not to take more than prescribed Pain, Sleep and Anxiety Medications.  Consultants: Gastroenterologist, general surgeon Procedures performed: ERCP Disposition: Home Diet recommendation:  Discharge Diet Orders (From admission, onward)     Start     Ordered   05/25/23 0000  Diet - low sodium heart healthy        05/25/23 1351           Cardiac diet DISCHARGE MEDICATION: Allergies as of 05/25/2023   No Known Allergies      Medication List     TAKE these medications    Apixaban Starter Pack (10mg  and 5mg ) Commonly known as: ELIQUIS STARTER PACK Take as directed on package: start with two-5mg  tablets  twice daily for 7 days. On day 8, switch to one-5mg  tablet twice daily.   nicotine 21 mg/24hr patch Commonly known as: NICODERM CQ - dosed in mg/24 hours Place 1 patch (21 mg total) onto the skin daily.   oxyCODONE  5 MG immediate release tablet Commonly known as: Oxy  IR/ROXICODONE  Take 1 tablet (5 mg total) by mouth every 12 (twelve) hours as needed for moderate pain (pain score 4-6).   prazosin 1 MG capsule Commonly known as: MINIPRESS Take 1 capsule (1 mg total) by mouth at bedtime.   sertraline 25 MG tablet Commonly known as: ZOLOFT Take 1 tablet (25 mg total) by mouth daily.        Follow-up Information     [name], MD. Schedule an appointment as soon as possible for a visit in 1 month(s).   Specialty: General Surgery Why: Follow up in ~1 month for choledocholithiasis; discuss interval cholecystectomy Contact information: [address] [place] [address] [phone]         Carlean Charter, DO. Go on 07/23/2023.   Specialty: Family Medicine Why: Please arrive for your NEW PATIENT appointment at 10:30am Contact information: [address] [place] [address] [place] [address] [phone]         Annamae Barrett, [facility]. Schedule an appointment as soon as possible for a visit in 2 week(s).   Contact information: [address] [place] [address] [phone]                Discharge Exam: Cleavon Curls Weights   05/22/23 1214  Weight: 113.4 kg   GEN: NAD SKIN: Warm and dry EYES: No pallor or icterus ENT: MMM CV: RRR PULM: CTA B ABD: soft, obese, NT, +BS CNS: AAO x 3, non focal EXT: No edema or tenderness   Condition at discharge: good  The results of significant diagnostics from this hospitalization (including imaging, microbiology, ancillary and laboratory) are listed below for reference.   Imaging Studies: DG C-Arm 1-60 Min-No Report Result Date: 05/24/2023 Fluoroscopy was utilized by the requesting physician.  No radiographic interpretation.   MR ABDOMEN MRCP W WO CONTAST Result Date: 05/23/2023 CLINICAL DATA:  Right upper quadrant abdominal pain. Evaluate for choledocholithiasis. EXAM: MRI ABDOMEN WITHOUT AND WITH CONTRAST (INCLUDING MRCP) TECHNIQUE: Multiplanar  multisequence MR imaging of the abdomen was performed both before and after the administration of intravenous contrast. Heavily T2-weighted images of the biliary and pancreatic ducts were obtained, and three-dimensional MRCP images were rendered by post processing. CONTRAST:  10mL GADAVIST GADOBUTROL 1 MMOL/ML IV SOLN COMPARISON:  None Available. FINDINGS: Lower chest: No acute findings. Hepatobiliary: No focal liver abnormality. Multiple stones identified within the gallbladder. The largest stone is in the neck measuring 1.1 cm, image 17/9. Gallbladder appears decompressed. No significant gallbladder wall thickening or pericholecystic inflammation. Multiple stones identified within the mid and distal common bile duct which measure up to 9 mm. Mild fusiform dilatation of the common bile duct measures up to 8 mm at the level of the mid duct. Mild central biliary dilatation. Pancreas: No mass, inflammatory changes, or other parenchymal abnormality identified. Spleen:  Within normal limits in size and appearance. Adrenals/Urinary Tract: No masses identified. No evidence of hydronephrosis. Stomach/Bowel: The stomach appears normal. There is no dilated loops of large or small bowel. Vascular/Lymphatic: No pathologically enlarged lymph nodes identified. No abdominal aortic aneurysm demonstrated. Other:  No ascites or focal fluid collections. Musculoskeletal: No suspicious bone lesions identified. IMPRESSION: 1. Cholelithiasis without secondary signs of  acute cholecystitis. 2. Choledocholithiasis with mild fusiform dilatation of the common bile duct and mild central biliary dilatation. The mid common bile duct measures up to 8 mm. Electronically Signed   By: Kimberley Penman M.D.   On: 05/23/2023 03:57   MR 3D Recon At Scanner Result Date: 05/23/2023 CLINICAL DATA:  Right upper quadrant abdominal pain. Evaluate for choledocholithiasis. EXAM: MRI ABDOMEN WITHOUT AND WITH CONTRAST (INCLUDING MRCP) TECHNIQUE: Multiplanar  multisequence MR imaging of the abdomen was performed both before and after the administration of intravenous contrast. Heavily T2-weighted images of the biliary and pancreatic ducts were obtained, and three-dimensional MRCP images were rendered by post processing. CONTRAST:  10mL GADAVIST GADOBUTROL 1 MMOL/ML IV SOLN COMPARISON:  None Available. FINDINGS: Lower chest: No acute findings. Hepatobiliary: No focal liver abnormality. Multiple stones identified within the gallbladder. The largest stone is in the neck measuring 1.1 cm, image 17/9. Gallbladder appears decompressed. No significant gallbladder wall thickening or pericholecystic inflammation. Multiple stones identified within the mid and distal common bile duct which measure up to 9 mm. Mild fusiform dilatation of the common bile duct measures up to 8 mm at the level of the mid duct. Mild central biliary dilatation. Pancreas: No mass, inflammatory changes, or other parenchymal abnormality identified. Spleen:  Within normal limits in size and appearance. Adrenals/Urinary Tract: No masses identified. No evidence of hydronephrosis. Stomach/Bowel: The stomach appears normal. There is no dilated loops of large or small bowel. Vascular/Lymphatic: No pathologically enlarged lymph nodes identified. No abdominal aortic aneurysm demonstrated. Other:  No ascites or focal fluid collections. Musculoskeletal: No suspicious bone lesions identified. IMPRESSION: 1. Cholelithiasis without secondary signs of acute cholecystitis. 2. Choledocholithiasis with mild fusiform dilatation of the common bile duct and mild central biliary dilatation. The mid common bile duct measures up to 8 mm. Electronically Signed   By: Kimberley Penman M.D.   On: 05/23/2023 03:57   CT Head Wo Contrast Result Date: 05/22/2023 CLINICAL DATA:  Headache sudden, severe. EXAM: CT HEAD WITHOUT CONTRAST TECHNIQUE: Contiguous axial images were obtained from the base of the skull through the vertex without  intravenous contrast. RADIATION DOSE REDUCTION: This exam was performed according to the departmental dose-optimization program which includes automated exposure control, adjustment of the mA and/or kV according to patient size and/or use of iterative reconstruction technique. COMPARISON:  None Available. FINDINGS: Brain: No acute infarct, hemorrhage, or mass lesion is present. No significant white matter lesions are present. Deep brain nuclei are within normal limits. The ventricles are of normal size. No significant extraaxial fluid collection is present. The brainstem and cerebellum are within normal limits. Midline structures are within normal limits. Vascular: No hyperdense vessel or unexpected calcification. Skull: Calvarium is intact. No focal lytic or blastic lesions are present. Left supraorbital frontal scalp soft tissue swelling is present. No underlying fracture or foreign body is present. No other significant extracranial soft tissue injury is present. Sinuses/Orbits: A small polyp or mucous retention cyst is present posteriorly in the right sphenoid sinus. The paranasal sinuses and mastoid air cells are otherwise clear. The globes and orbits are within normal limits. IMPRESSION: 1. Left supraorbital frontal scalp soft tissue swelling without underlying fracture or foreign body. 2. Normal CT appearance of the brain. 3. Small polyp or mucous retention cyst posteriorly in the right sphenoid sinus. Electronically Signed   By: Audree Leas M.D.   On: 05/22/2023 16:33   US  Venous Img Lower Unilateral Left Result Date: 05/22/2023 CLINICAL DATA:  Left lower extremity pain and  swelling. EXAM: Left LOWER EXTREMITY VENOUS DOPPLER ULTRASOUND TECHNIQUE: Gray-scale sonography with compression, as well as color and duplex ultrasound, were performed to evaluate the deep venous system(s) from the level of the common femoral vein through the popliteal and proximal calf veins. COMPARISON:  None Available.  FINDINGS: VENOUS Normal compressibility of the common femoral, superficial femoral, and popliteal veins, as well as the visualized calf veins. Visualized portions of profunda femoral vein and great saphenous vein unremarkable. No filling defects to suggest DVT on grayscale or color Doppler imaging. Doppler waveforms show normal direction of venous flow, normal respiratory plasticity and response to augmentation. Limited views of the contralateral common femoral vein are unremarkable. OTHER None. Limitations: none IMPRESSION: Negative left lower extremity venous Doppler ultrasound.  No DVT. Electronically Signed   By: Audree Leas M.D.   On: 05/22/2023 16:28   CT Angio Chest PE W/Cm &/Or Wo Cm Result Date: 05/22/2023 CLINICAL DATA:  Right-sided chest pain.  Left-sided leg pain. EXAM: CT ANGIOGRAPHY CHEST WITH CONTRAST TECHNIQUE: Multidetector CT imaging of the chest was performed using the standard protocol during bolus administration of intravenous contrast. Multiplanar CT image reconstructions and MIPs were obtained to evaluate the vascular anatomy. RADIATION DOSE REDUCTION: This exam was performed according to the departmental dose-optimization program which includes automated exposure control, adjustment of the mA and/or kV according to patient size and/or use of iterative reconstruction technique. CONTRAST:  75mL OMNIPAQUE IOHEXOL 350 MG/ML SOLN COMPARISON:  09/01/2016 chest radiograph from [facility]. No comparison CT. FINDINGS: Cardiovascular: The quality of this exam for evaluation of pulmonary embolism is sufficient. Isolated filling defect within the posterior right upper lobe segmental and subsegmental pulmonary artery branches, relatively occlusive including on 136/9 and coronal image 78. Normal aortic caliber. Mild cardiomegaly, without pericardial effusion. Mediastinum/Nodes: Borderline to mild right hilar adenopathy is likely reactive at 1.4 cm. No mediastinal or left hilar adenopathy.  Lungs/Pleura: Trace right pleural fluid. Mild paraseptal and centrilobular emphysema. Posterior right upper lobe pleural-based airspace opacity including on 34/8. 3 mm pleural-based left upper lobe pulmonary nodule on 31/8. Upper Abdomen: Multiple small gallstones. Focal steatosis adjacent the falciform ligament. Normal imaged portions of the spleen, stomach, pancreas, adrenal glands, kidneys Although there is no significant common duct dilatation, choledocholithiasis is seen including stones of up to 5 mm on 146/7 and coronal image 63. Musculoskeletal: No acute osseous abnormality. Minimal convex left thoracic spine curvature. Review of the MIP images confirms the above findings. IMPRESSION: 1. Posterior right upper lobe segmental to subsegmental pulmonary embolism, likely occlusive. Posterior right upper lobe infarct. 2. Trace right pleural fluid 3. Cholelithiasis. Choledocholithiasis without significant biliary duct dilatation. 4. Emphysema (ICD10-J43.9). left upper lobe 3 mm pulmonary nodule. No follow-up needed if patient is low-risk. Non-contrast chest CT can be considered in 12 months if patient is high-risk, nodule is upper lobe, and/or suspicious in morphology. This recommendation follows the consensus statement: Guidelines for Management of Incidental Pulmonary Nodules Detected on CT Images: From the Fleischner Society 2017; Radiology 2017; 284:228-243. Critical test results telephoned to . Dr. Cleora Daft. At the time of interpretation at . 3:45 p.m.On . 05/22/2023. Electronically Signed   By: Lore Rode M.D.   On: 05/22/2023 15:44    Microbiology: Results for orders placed or performed during the hospital encounter of 08/10/18  SARS Coronavirus 2 (CEPHEID - Performed in [facility] hospital lab), Hosp Order     Status: None   Collection Time: 08/10/18  1:32 PM   Specimen: Nasopharyngeal Swab  Result Value  Ref Range Status   SARS Coronavirus 2 NEGATIVE NEGATIVE Final    Comment: (NOTE) If result  is NEGATIVE SARS-CoV-2 target nucleic acids are NOT DETECTED. The SARS-CoV-2 RNA is generally detectable in upper and lower  respiratory specimens during the acute phase of infection. The lowest  concentration of SARS-CoV-2 viral copies this assay can detect is 250  copies / mL. A negative result does not preclude SARS-CoV-2 infection  and should not be used as the sole basis for treatment or other  patient management decisions.  A negative result may occur with  improper specimen collection / handling, submission of specimen other  than nasopharyngeal swab, presence of viral mutation(s) within the  areas targeted by this assay, and inadequate number of viral copies  (<250 copies / mL). A negative result must be combined with clinical  observations, patient history, and epidemiological information. If result is POSITIVE SARS-CoV-2 target nucleic acids are DETECTED. The SARS-CoV-2 RNA is generally detectable in upper and lower  respiratory specimens dur ing the acute phase of infection.  Positive  results are indicative of active infection with SARS-CoV-2.  Clinical  correlation with patient history and other diagnostic information is  necessary to determine patient infection status.  Positive results do  not rule out bacterial infection or co-infection with other viruses. If result is PRESUMPTIVE POSTIVE SARS-CoV-2 nucleic acids MAY BE PRESENT.   A presumptive positive result was obtained on the submitted specimen  and confirmed on repeat testing.  While 2019 novel coronavirus  (SARS-CoV-2) nucleic acids may be present in the submitted sample  additional confirmatory testing may be necessary for epidemiological  and / or clinical management purposes  to differentiate between  SARS-CoV-2 and other Sarbecovirus currently known to infect humans.  If clinically indicated additional testing with an alternate test  methodology [phone]) is advised. The SARS-CoV-2 RNA is generally  detectable  in upper and lower respiratory sp ecimens during the acute  phase of infection. The expected result is Negative. Fact Sheet for Patients:  BoilerBrush.com.cy Fact Sheet for Healthcare Providers: https://pope.com/ This test is not yet approved or cleared by the [place]  FDA and has been authorized for detection and/or diagnosis of SARS-CoV-2 by FDA under an Emergency Use Authorization (EUA).  This EUA will remain in effect (meaning this test can be used) for the duration of the COVID-19 declaration under Section 564(b)(1) of the Act, 21 U.S.C. section 360bbb-3(b)(1), unless the authorization is terminated or revoked sooner. Performed at [facility], [address] [place], [place] [address]     Labs: CBC: Recent Labs  Lab 05/22/23 1251 05/23/23 0601 05/24/23 0143 05/25/23 0546  WBC 4.3 3.9* 4.5 3.6*  NEUTROABS 2.9  --   --   --   HGB 14.1 13.4 13.8 14.3  HCT 42.6 40.0 41.2 41.9  MCV 87.7 87.1 87.1 87.3  PLT 170 168 178 174   Basic Metabolic Panel: Recent Labs  Lab 05/22/23 1350 05/23/23 0601 05/25/23 1233  NA 137 136 137  K 3.6 3.8 4.0  CL 103 105 102  CO2 24 28 28   GLUCOSE 87 89 96  BUN 11 10 8   CREATININE 0.65 0.74 0.84  CALCIUM 8.7* 8.1* 9.0   Liver Function Tests: Recent Labs  Lab 05/22/23 1350 05/23/23 0601 05/25/23 1233  AST 17 13* 20  ALT 11 10 15   ALKPHOS 58 56 67  BILITOT 0.8 0.5 0.7  PROT 6.6 6.2* 6.9  ALBUMIN 3.4* 3.1* 3.6   CBG: No results  for input(s): "GLUCAP" in the last 168 hours.  Discharge time spent: greater than 30 minutes.  Signed: Sheril Dines, MD [facility] 05/25/2023

## 2023-05-25 NOTE — Progress Notes (Signed)
 Patient provided with 2 t-shirts, per request.   Patient states he will need taxi to [facility]. Provided voucher by TOC.  Patient aware PCP establishment appointment has been made. Also aware [facility] will be reaching out to establish counseling outpatient.  Patient provided with information for [employer], [address] and [employer].  Confirmed receipt of parole note with parole officer. Patient also given original note to provide, if needed.  Patient also provided with listing of medications for parole drug screen compliance.
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
## 2023-05-27 ENCOUNTER — Emergency Department

## 2023-05-27 ENCOUNTER — Inpatient Hospital Stay
Admission: EM | Admit: 2023-05-27 | Discharge: 2023-05-31 | DRG: 378 | Disposition: A | Discharge status: Home / Self Care | Attending: Internal Medicine | Admitting: Internal Medicine

## 2023-05-27 ENCOUNTER — Other Ambulatory Visit: Payer: Self-pay

## 2023-05-27 DIAGNOSIS — K625 Hemorrhage of anus and rectum: Secondary | ICD-10-CM | POA: Diagnosis present

## 2023-05-27 DIAGNOSIS — F1910 Other psychoactive substance abuse, uncomplicated: Secondary | ICD-10-CM | POA: Diagnosis present

## 2023-05-27 DIAGNOSIS — Z6833 Body mass index (BMI) 33.0-33.9, adult: Secondary | ICD-10-CM

## 2023-05-27 DIAGNOSIS — Z5982 Transportation insecurity: Secondary | ICD-10-CM

## 2023-05-27 DIAGNOSIS — Z5941 Food insecurity: Secondary | ICD-10-CM

## 2023-05-27 DIAGNOSIS — R109 Unspecified abdominal pain: Secondary | ICD-10-CM | POA: Diagnosis not present

## 2023-05-27 DIAGNOSIS — N50811 Right testicular pain: Secondary | ICD-10-CM | POA: Diagnosis not present

## 2023-05-27 DIAGNOSIS — F319 Bipolar disorder, unspecified: Secondary | ICD-10-CM | POA: Diagnosis present

## 2023-05-27 DIAGNOSIS — F3112 Bipolar disorder, current episode manic without psychotic features, moderate: Secondary | ICD-10-CM | POA: Diagnosis present

## 2023-05-27 DIAGNOSIS — E669 Obesity, unspecified: Secondary | ICD-10-CM | POA: Diagnosis present

## 2023-05-27 DIAGNOSIS — K8020 Calculus of gallbladder without cholecystitis without obstruction: Secondary | ICD-10-CM | POA: Diagnosis present

## 2023-05-27 DIAGNOSIS — R197 Diarrhea, unspecified: Secondary | ICD-10-CM | POA: Diagnosis present

## 2023-05-27 DIAGNOSIS — F17210 Nicotine dependence, cigarettes, uncomplicated: Secondary | ICD-10-CM | POA: Diagnosis present

## 2023-05-27 DIAGNOSIS — F4310 Post-traumatic stress disorder, unspecified: Secondary | ICD-10-CM | POA: Diagnosis present

## 2023-05-27 DIAGNOSIS — F1210 Cannabis abuse, uncomplicated: Secondary | ICD-10-CM | POA: Diagnosis present

## 2023-05-27 DIAGNOSIS — K8070 Calculus of gallbladder and bile duct without cholecystitis without obstruction: Secondary | ICD-10-CM | POA: Diagnosis present

## 2023-05-27 DIAGNOSIS — Z7901 Long term (current) use of anticoagulants: Secondary | ICD-10-CM

## 2023-05-27 DIAGNOSIS — Z5900 Homelessness unspecified: Secondary | ICD-10-CM

## 2023-05-27 DIAGNOSIS — I2699 Other pulmonary embolism without acute cor pulmonale: Secondary | ICD-10-CM | POA: Diagnosis not present

## 2023-05-27 DIAGNOSIS — K921 Melena: Principal | ICD-10-CM | POA: Diagnosis present

## 2023-05-27 DIAGNOSIS — K922 Gastrointestinal hemorrhage, unspecified: Principal | ICD-10-CM

## 2023-05-27 DIAGNOSIS — Z720 Tobacco use: Secondary | ICD-10-CM | POA: Diagnosis present

## 2023-05-27 DIAGNOSIS — Z79899 Other long term (current) drug therapy: Secondary | ICD-10-CM

## 2023-05-27 DIAGNOSIS — Z86711 Personal history of pulmonary embolism: Secondary | ICD-10-CM

## 2023-05-27 HISTORY — DX: Hemorrhage of anus and rectum: K62.5

## 2023-05-27 LAB — CBC WITH DIFFERENTIAL/PLATELET
Abs Immature Granulocytes: 0.01 10*3/uL (ref 0.00–0.07)
Basophils Absolute: 0 10*3/uL (ref 0.0–0.1)
Basophils Relative: 0 %
Eosinophils Absolute: 0 10*3/uL (ref 0.0–0.5)
Eosinophils Relative: 0 %
HCT: 43.3 % (ref 39.0–52.0)
Hemoglobin: 14.1 g/dL (ref 13.0–17.0)
Immature Granulocytes: 0 %
Lymphocytes Relative: 25 %
Lymphs Abs: 1.1 10*3/uL (ref 0.7–4.0)
MCH: 28.7 pg (ref 26.0–34.0)
MCHC: 32.6 g/dL (ref 30.0–36.0)
MCV: 88.2 fL (ref 80.0–100.0)
Monocytes Absolute: 0.3 10*3/uL (ref 0.1–1.0)
Monocytes Relative: 8 %
Neutro Abs: 3.1 10*3/uL (ref 1.7–7.7)
Neutrophils Relative %: 67 %
Platelets: 206 10*3/uL (ref 150–400)
RBC: 4.91 MIL/uL (ref 4.22–5.81)
RDW: 13.5 % (ref 11.5–15.5)
WBC: 4.6 10*3/uL (ref 4.0–10.5)
nRBC: 0 % (ref 0.0–0.2)

## 2023-05-27 LAB — CBC
HCT: 39 % (ref 39.0–52.0)
Hemoglobin: 12.8 g/dL — ABNORMAL LOW (ref 13.0–17.0)
MCH: 29 pg (ref 26.0–34.0)
MCHC: 32.8 g/dL (ref 30.0–36.0)
MCV: 88.4 fL (ref 80.0–100.0)
Platelets: 180 10*3/uL (ref 150–400)
RBC: 4.41 MIL/uL (ref 4.22–5.81)
RDW: 13.4 % (ref 11.5–15.5)
WBC: 4.7 10*3/uL (ref 4.0–10.5)
nRBC: 0 % (ref 0.0–0.2)

## 2023-05-27 LAB — COMPREHENSIVE METABOLIC PANEL WITH GFR
ALT: 35 U/L (ref 0–44)
AST: 38 U/L (ref 15–41)
Albumin: 4.1 g/dL (ref 3.5–5.0)
Alkaline Phosphatase: 67 U/L (ref 38–126)
Anion gap: 9 (ref 5–15)
BUN: 12 mg/dL (ref 6–20)
CO2: 23 mmol/L (ref 22–32)
Calcium: 8.9 mg/dL (ref 8.9–10.3)
Chloride: 103 mmol/L (ref 98–111)
Creatinine, Ser: 0.83 mg/dL (ref 0.61–1.24)
GFR, Estimated: >60 mL/min (ref >60)
Glucose, Bld: 89 mg/dL (ref 70–99)
Potassium: 3.6 mmol/L (ref 3.5–5.1)
Sodium: 135 mmol/L (ref 135–145)
Total Bilirubin: 0.9 mg/dL (ref 0.0–1.2)
Total Protein: 7.6 g/dL (ref 6.5–8.1)

## 2023-05-27 LAB — TYPE AND SCREEN
ABO/RH(D): AB NEG
Antibody Screen: NEGATIVE

## 2023-05-27 LAB — LIPASE, BLOOD: Lipase: 20 U/L (ref 11–51)

## 2023-05-27 MED ORDER — NICOTINE 21 MG/24HR TD PT24
21.0000 mg | MEDICATED_PATCH | Freq: Every day | TRANSDERMAL | Status: DC
  Administered 2023-05-27 – 2023-05-30 (×3): 21 mg via TRANSDERMAL
  Filled 2023-05-27 (×4): qty 1

## 2023-05-27 MED ORDER — ACETAMINOPHEN 325 MG PO TABS
650.0000 mg | ORAL_TABLET | Freq: Four times a day (QID) | ORAL | Status: DC | PRN
  Filled 2023-05-27: qty 2

## 2023-05-27 MED ORDER — SODIUM CHLORIDE 0.9 % IV BOLUS
1000.0000 mL | Freq: Once | INTRAVENOUS | Status: AC
  Administered 2023-05-27: 1000 mL via INTRAVENOUS

## 2023-05-27 MED ORDER — IOHEXOL 350 MG/ML SOLN
100.0000 mL | Freq: Once | INTRAVENOUS | Status: AC | PRN
  Administered 2023-05-27: 100 mL via INTRAVENOUS

## 2023-05-27 MED ORDER — OXYCODONE-ACETAMINOPHEN 5-325 MG PO TABS
1.0000 | ORAL_TABLET | ORAL | Status: DC | PRN
  Administered 2023-05-28 (×2): 1 via ORAL
  Filled 2023-05-27 (×3): qty 1

## 2023-05-27 MED ORDER — PANTOPRAZOLE SODIUM 40 MG IV SOLR
40.0000 mg | Freq: Two times a day (BID) | INTRAVENOUS | Status: DC
  Administered 2023-05-27 – 2023-05-30 (×6): 40 mg via INTRAVENOUS
  Filled 2023-05-27 (×6): qty 10

## 2023-05-27 MED ORDER — ONDANSETRON HCL 4 MG/2ML IJ SOLN
4.0000 mg | Freq: Three times a day (TID) | INTRAMUSCULAR | Status: DC | PRN
  Administered 2023-05-28 – 2023-05-31 (×6): 4 mg via INTRAVENOUS
  Filled 2023-05-27 (×6): qty 2

## 2023-05-27 MED ORDER — MORPHINE SULFATE (PF) 2 MG/ML IV SOLN
2.0000 mg | INTRAVENOUS | Status: DC | PRN
  Administered 2023-05-29 (×2): 2 mg via INTRAVENOUS
  Filled 2023-05-27 (×2): qty 1

## 2023-05-27 MED ORDER — PRAZOSIN HCL 1 MG PO CAPS
1.0000 mg | ORAL_CAPSULE | Freq: Every day | ORAL | Status: DC
  Administered 2023-05-27 – 2023-05-30 (×4): 1 mg via ORAL
  Filled 2023-05-27 (×4): qty 1

## 2023-05-27 MED ORDER — LORAZEPAM 2 MG/ML IJ SOLN: 2.0000 mg | INTRAMUSCULAR | Status: DC | PRN

## 2023-05-27 MED ORDER — SODIUM CHLORIDE 0.9 % IV SOLN: INTRAVENOUS | Status: AC

## 2023-05-27 MED ORDER — SERTRALINE HCL 50 MG PO TABS
25.0000 mg | ORAL_TABLET | Freq: Every day | ORAL | Status: DC
  Administered 2023-05-28 – 2023-05-31 (×4): 25 mg via ORAL
  Filled 2023-05-27 (×4): qty 1

## 2023-05-27 NOTE — ED Provider Triage Note (Signed)
 Emergency Medicine Provider Triage Evaluation Note  Todd Ware , a 38 y.o. male  was evaluated in triage.  Pt complains of abdominal pain.  Was seen here on 4/26, admitted for gallstones.  They want to wait to do surgery as he is on blood thinner.  Continues to have increasing abdominal pain.  Nausea..  Review of Systems  Positive:  Negative:   Physical Exam  There were no vitals taken for this visit. Gen:   Awake, no distress   Resp:  Normal effort  MSK:   Moves extremities without difficulty  Other:    Medical Decision Making  Medically screening exam initiated at 1:21 PM.  Appropriate orders placed.  Todd Ware was informed that the remainder of the evaluation will be completed by another provider, this initial triage assessment does not replace that evaluation, and the importance of remaining in the ED until their evaluation is complete.     Delsie Figures, PA-C 05/27/23 1322

## 2023-05-27 NOTE — H&P (Signed)
 History and Physical    Todd Ware UJW:[id number] DOB: November 11, 1985 DOA: 05/27/2023  Referring MD/NP/PA:   PCP: Pcp, No   Patient coming from:  The patient is coming from hotel (pt is homeless)   Chief Complaint: abdominal pain, diarrhea with dark stool  HPI: [name] is a 38 y.o. male with medical history significant of homeless, tobacco abuse, marijuana abuse, bipolar, PTSD, obesity, recent admission due to PE and cholelithiasis and choledocholithiasis, who presents with abdominal pain, diarrhea with dark stool.  Patient was recently hospitalized from 4/26 - 4/29. Pt was found to have acute PE.  Lower extremity venous Doppler was negative for DVT.  Patient was started on Eliquis . Pt also had cholelithiasis and choledocholithiasis. He was s/p ERCP with biliary sphincterectomy and balloon extraction on 05/24/2023. Pt had mid common bile duct measures up to 8 mm. He was evaluated by general surgeon.  Outpatient follow-up was recommended for for elective cholecystectomy.  Pt state that he has worsening abdominal pain today, associated with nausea and diarrhea with black stool.  No vomiting.  He has had 3 episode of diarrhea with dark stool.  His abdominal pain is located in the upper abdomen, constant, sharp, moderate to severe, nonradiating.  Patient has poor appetite and decreased oral intake.  No fever or chills.  Patient does not have chest pain, cough, SOB.  No symptoms of UTI. Patient took last dose of Eliquis  this morning.   Data reviewed independently and ED Course: pt was found to have hemoglobin 14.1, WBC 4.6, normal liver function, GFR > 60, positive FOBT per ED physician.  Temperature normal, blood pressure 143/84, heart rate 89 --> 43, RR 16, oxygen saturation 98% on room air.  Patient is placed in telemetry bed for observation.  Dr. Vonna Guardian of [facility] is consulted.  Message sent to Dr. Mamie Searles of GI for consult.   Right upper quadrant ultrasound: 1. Cholelithiasis without acute  cholecystitis. 2. Otherwise unremarkable exam.  CTA-GI bleeding protocol VASCULAR 1. No evidence of active gastrointestinal bleeding. 2. No significant vascular findings.   NON-VASCULAR  1. Cholelithiasis without evidence of acute cholecystitis. Interval removal or passage of the choledocholithiasis seen previously, with resolution of the mild biliary duct dilation noted on prior MRI. 2. Minimal distal colonic diverticulosis without diverticulitis.      EKG: I have personally reviewed.  Sinus rhythm, QTc 409, no ischemic change.   Review of Systems:   General: no fevers, chills, no body weight gain, has poor appetite, has fatigue HEENT: no blurry vision, hearing changes or sore throat Respiratory: no dyspnea, coughing, wheezing CV: no chest pain, no palpitations GI: has nausea, abdominal pain, diarrhea with dark stool GU: no dysuria, burning on urination, increased urinary frequency, hematuria  Ext: no leg edema Neuro: no unilateral weakness, numbness, or tingling, no vision change or hearing loss Skin: no rash, no skin tear. MSK: No muscle spasm, no deformity, no limitation of range of movement in spin Heme: No easy bruising.  Travel history: No recent long distant travel.   Allergy: No Known Allergies  Past Medical History:  Diagnosis Date   Seizures [facility])     Past Surgical History:  Procedure Laterality Date   ERCP N/A 05/24/2023   Procedure: ERCP, WITH INTERVENTION IF INDICATED;  Surgeon: Marnee Sink, MD;  Location: [facility] ENDOSCOPY;  Service: Endoscopy;  Laterality: N/A;   FACIAL FRACTURE SURGERY     SPYGLASS LITHOTRIPSY  05/24/2023   Procedure: ERCP, WITH CHOLANGIOSCOPY AND LITHOTRIPSY;  Surgeon: Marnee Sink,  MD;  Location: [facility] ENDOSCOPY;  Service: Endoscopy;;    Social History:  reports that he has been smoking cigarettes. He has never used smokeless tobacco. He reports current drug use. Drug: Marijuana. He reports that he does not drink alcohol.  Family  History: I have tried to review with patient about his family medical history, but the patient states that he does not know detailed information about his family medical history.  Prior to Admission medications   Medication Sig Start Date End Date Taking? Authorizing Provider  APIXABAN  (ELIQUIS ) VTE STARTER PACK (10MG  AND 5MG ) Take as directed on package: start with two-5mg  tablets twice daily for 7 days. On day 8, switch to one-5mg  tablet twice daily. 05/25/23   Sheril Dines, MD  nicotine  (NICODERM CQ  - DOSED IN MG/24 HOURS) 21 mg/24hr patch Place 1 patch (21 mg total) onto the skin daily. 05/25/23   Sheril Dines, MD  oxyCODONE  (OXY IR/ROXICODONE ) 5 MG immediate release tablet Take 1 tablet (5 mg total) by mouth every 12 (twelve) hours as needed for moderate pain (pain score 4-6). 05/25/23   Sheril Dines, MD  prazosin  (MINIPRESS ) 1 MG capsule Take 1 capsule (1 mg total) by mouth at bedtime. 05/25/23   Sheril Dines, MD  sertraline  (ZOLOFT ) 25 MG tablet Take 1 tablet (25 mg total) by mouth daily. 05/25/23   Sheril Dines, MD    Physical Exam: Vitals:   05/27/23 1321 05/27/23 1322 05/27/23 1730  BP: 128/88  (!) 143/84  Pulse: 89  (!) 43  Resp: 16  16  Temp: 97.6 F (36.4 C)  98.4 F (36.9 C)  TempSrc: Oral  Oral  SpO2: 96%  98%  Weight:  113 kg   Height:  6' (1.829 m)    General: Not in acute distress HEENT:       Eyes: PERRL, EOMI, no jaundice       ENT: No discharge from the ears and nose, no pharynx injection, no tonsillar enlargement.        Neck: No JVD, no bruit, no mass felt. Heme: No neck lymph node enlargement. Cardiac: S1/S2, RRR, No murmurs, No gallops or rubs. Respiratory: No rales, wheezing, rhonchi or rubs. GI: Soft, nondistended, has tenderness in the upper abdomen, no rebound pain, no organomegaly, BS present. GU: No hematuria Ext: No pitting leg edema bilaterally. 1+DP/PT pulse bilaterally. Musculoskeletal: No joint deformities, No joint redness or warmth, no  limitation of ROM in spin. Skin: No rashes.  Neuro: Alert, oriented X3, cranial nerves II-XII grossly intact, moves all extremities normally.  Psych: Patient is not psychotic, no suicidal or hemocidal ideation.  Labs on Admission: I have personally reviewed following labs and imaging studies  CBC: Recent Labs  Lab 05/22/23 1251 05/23/23 0601 05/24/23 0143 05/25/23 0546 05/27/23 1326  WBC 4.3 3.9* 4.5 3.6* 4.6  NEUTROABS 2.9  --   --   --  3.1  HGB 14.1 13.4 13.8 14.3 14.1  HCT 42.6 40.0 41.2 41.9 43.3  MCV 87.7 87.1 87.1 87.3 88.2  PLT 170 168 178 174 206   Basic Metabolic Panel: Recent Labs  Lab 05/22/23 1350 05/23/23 0601 05/25/23 1233 05/27/23 1326  NA 137 136 137 135  K 3.6 3.8 4.0 3.6  CL 103 105 102 103  CO2 24 28 28 23   GLUCOSE 87 89 96 89  BUN 11 10 8 12   CREATININE 0.65 0.74 0.84 0.83  CALCIUM 8.7* 8.1* 9.0 8.9   GFR: Estimated Creatinine Clearance: 161.3 mL/min (by C-G formula  based on SCr of 0.83 mg/dL). Liver Function Tests: Recent Labs  Lab 05/22/23 1350 05/23/23 0601 05/25/23 1233 05/27/23 1326  AST 17 13* 20 38  ALT 11 10 15  35  ALKPHOS 58 56 67 67  BILITOT 0.8 0.5 0.7 0.9  PROT 6.6 6.2* 6.9 7.6  ALBUMIN 3.4* 3.1* 3.6 4.1   Recent Labs  Lab 05/25/23 1233 05/27/23 1326  LIPASE 26 20   No results for input(s): "AMMONIA" in the last 168 hours. Coagulation Profile: Recent Labs  Lab 05/22/23 1653  INR 1.0   Cardiac Enzymes: No results for input(s): "CKTOTAL", "CKMB", "CKMBINDEX", "TROPONINI" in the last 168 hours. BNP (last 3 results) No results for input(s): "PROBNP" in the last 8760 hours. HbA1C: No results for input(s): "HGBA1C" in the last 72 hours. CBG: No results for input(s): "GLUCAP" in the last 168 hours. Lipid Profile: No results for input(s): "CHOL", "HDL", "LDLCALC", "TRIG", "CHOLHDL", "LDLDIRECT" in the last 72 hours. Thyroid Function Tests: No results for input(s): "TSH", "T4TOTAL", "FREET4", "T3FREE", "THYROIDAB"  in the last 72 hours. Anemia Panel: No results for input(s): "VITAMINB12", "FOLATE", "FERRITIN", "TIBC", "IRON", "RETICCTPCT" in the last 72 hours. Urine analysis: No results found for: "COLORURINE", "APPEARANCEUR", "LABSPEC", "PHURINE", "GLUCOSEU", "HGBUR", "BILIRUBINUR", "KETONESUR", "PROTEINUR", "UROBILINOGEN", "NITRITE", "LEUKOCYTESUR" Sepsis Labs: @LABRCNTIP (procalcitonin:4,lacticidven:4) )No results found for this or any previous visit (from the past 240 hours).   Radiological Exams on Admission:   Assessment/Plan Principal Problem:   Rectal bleeding Active Problems:   Abdominal pain   Diarrhea   Pulmonary embolism (HCC)   Cholelithiasis   Homeless   Tobacco abuse   Bipolar 1 disorder with moderate mania (HCC)   Polysubstance abuse (HCC)   Obesity (BMI 30-39.9)   Assessment and Plan:  Rectal bleeding: pt has abdominal pain, diarrhea with dark stool.  Hemoglobin stable 14.1 now. CTA negative for active GI bleeding - will place in tele bed for obs - Hold off Eliquis  - IVF: 1L NS bolus, then at 75 mL/hr - Start IV pantoprazole  40 mg bid - Zofran  IV for nausea - Avoid NSAIDs and SQ heparin  - Maintain IV access (2 large bore IVs if possible). - Monitor closely and follow q6h cbc, transfuse as necessary, if Hgb<7.0 - LaB: INR, PTT and type screen - check C diff and GI path panel due to diarrhea  Abdominal pain: Patient has cholelithiasis, but no fever or leukocytosis, no evidence of cholecystitis.  Right upper quadrant ultrasound and CTA.  - Supportive care and pain control - As needed morphine , Percocet, Tylenol  - As needed Zofran  for nausea and vomiting - Outpatient follow-up for elective cholecystectomy.  Cholelithiasis: -see above  Diarrhea: -check C diff and GI path panel -IVF as above  Pulmonary embolism (HCC):  -hold Eliquis  -consulted Dr. Vonna Guardian of [facility] for possible IVC filter placement  Homeless -cons[facility]  Tobacco abuse and Polysubstance  abuse - Data counseling about importance of quitting substance use and smoking - Nicotine  patch  Bipolar 1 disorder with moderate mania (HCC) and PTSD -Zoloft , prazosin   Obesity (BMI 30-39.9): Body weight 113 kg, BMI 33.79 - Encourage losing weight - Exercise and healthy diet      DVT ppx: SCD  Code Status: Full code   Family Communication:     not done, no family member is at bed side.  Disposition Plan:  Anticipate discharge back to previous environment  Consults called:  Dr. Vonna Guardian of [facility] is consulted.  Message sent to Dr. Mamie Searles of GI for consult.  Admission status  and Level of care: Telemetry Medical:    for obs as inpt        Dispo: The patient is from:  Homeless, from [place]              Anticipated d/c is to:  to be determined              Anticipated d/c date is: 1 day              Patient currently is not medically stable to d/c.    Severity of Illness:  The appropriate patient status for this patient is OBSERVATION. Observation status is judged to be reasonable and necessary in order to provide the required intensity of service to ensure the patient's safety. The patient's presenting symptoms, physical exam findings, and initial radiographic and laboratory data in the context of their medical condition is felt to place them at decreased risk for further clinical deterioration. Furthermore, it is anticipated that the patient will be medically stable for discharge from the hospital within 2 midnights of admission.        Date of Service 05/27/2023    Fidencio Hue [facility]   If 7PM-7AM, please contact night-coverage [email or web address] 05/27/2023, 8:09 PM

## 2023-05-27 NOTE — ED Triage Notes (Signed)
 C/O mid abd pain since leaving hospital.  C/O diarrhea and unable to eat.  Diarrhea is black.

## 2023-05-27 NOTE — ED Provider Notes (Signed)
 [facility] Provider Note    Event Date/Time   First MD Initiated Contact with Patient 05/27/23 1515     (approximate)   History   No chief complaint on file.   HPI  TREYQUAN Ware is a 38 y.o. male with history of seizures presents emergency department with concerns of abdominal pain.  Patient had a ERCP with gallstone removal on 05/24/2023.  Continues to have abdominal pain.  States that he had discussed taking out the gallbladder but he is on a blood thinner .  States his stools are dark.  No fever, chills, vomiting.      Physical Exam   Triage Vital Signs: ED Triage Vitals  Encounter Vitals Group     BP 05/27/23 1321 128/88     Systolic BP Percentile --      Diastolic BP Percentile --      Pulse Rate 05/27/23 1321 89     Resp 05/27/23 1321 16     Temp 05/27/23 1321 97.6 F (36.4 C)     Temp Source 05/27/23 1321 Oral     SpO2 05/27/23 1321 96 %     Weight 05/27/23 1322 249 lb 1.9 oz (113 kg)     Height 05/27/23 1322 6' (1.829 m)     Head Circumference --      Peak Flow --      Pain Score 05/27/23 1322 8     Pain Loc --      Pain Education --      Exclude from Growth Chart --     Most recent vital signs: Vitals:   05/27/23 1321 05/27/23 1730  BP: 128/88 (!) 143/84  Pulse: 89 (!) 43  Resp: 16 16  Temp: 97.6 F (36.4 C) 98.4 F (36.9 C)  SpO2: 96% 98%     General: Awake, no distress.   CV:  Good peripheral perfusion. regular rate and  rhythm Resp:  Normal effort. Lungs cta Abd:  No distention.   Other:      ED Results / Procedures / Treatments   Labs (all labs ordered are listed, but only abnormal results are displayed) Labs Reviewed  COMPREHENSIVE METABOLIC PANEL WITH GFR  LIPASE, BLOOD  CBC WITH DIFFERENTIAL/PLATELET  URINALYSIS, ROUTINE W REFLEX MICROSCOPIC  CBC  CBC  CBC  PROTIME-INR  APTT  BASIC METABOLIC PANEL WITH GFR  TYPE AND SCREEN     EKG     RADIOLOGY Ultrasound  performed    PROCEDURES:   Procedures No chief complaint on file.     MEDICATIONS ORDERED IN ED: Medications  morphine  (PF) 2 MG/ML injection 2 mg (has no administration in time range)  oxyCODONE -acetaminophen  (PERCOCET/ROXICET) 5-325 MG per tablet 1 tablet (has no administration in time range)  ondansetron  (ZOFRAN ) injection 4 mg (has no administration in time range)  acetaminophen  (TYLENOL ) tablet 650 mg (has no administration in time range)  nicotine  (NICODERM CQ  - dosed in mg/24 hours) patch 21 mg (has no administration in time range)  pantoprazole  (PROTONIX ) injection 40 mg (has no administration in time range)  sodium chloride  0.9 % bolus 1,000 mL (1,000 mLs Intravenous New Bag/Given 05/27/23 1624)  iohexol  (OMNIPAQUE ) 350 MG/ML injection 100 mL (100 mLs Intravenous Contrast Given 05/27/23 1639)     IMPRESSION / MDM / ASSESSMENT AND PLAN / ED COURSE  I reviewed the triage vital signs and the nursing notes.  Differential diagnosis includes, but is not limited to, acute cholecystitis, acute choledocholithiasis, pancreatitis, GI bleed, viral illness, postop pain  Patient's presentation is most consistent with acute illness / injury with system symptoms.   Labs are reassuring  Hemoccult is positive,  Ultrasound shows cholelithiasis, no acute cholecystitis  I did discuss with Dr. Synetta Eves.  Due to him being on a blood thinner, has positive Hemoccult we will do CT and reassess   CT GI protocol was reassuring  Since he is on Eliquis  along with GI bleeding with positive Hemoccult decided to admit him to the hospital.  Consult to hospitalist  Spoke with Dr.Niu He will be admitting the patient.  He is aware of the patient having a GI bleed and is on Eliquis .  He is hemodynamically stable at this time.  Patient is in agreement for admission.  Did call his sister to notify that he is being admitted to the hospital.  This was done at the patient's  request.   FINAL CLINICAL IMPRESSION(S) / ED DIAGNOSES   Final diagnoses:  Acute upper GI bleed     Rx / DC Orders   ED Discharge Orders     None        Note:  This document was prepared using Dragon voice recognition software and may include unintentional dictation errors.    Delsie Figures, PA-C 05/27/23 Wilmer Hash    Claria Crofts, MD 05/27/23 [phone]

## 2023-05-28 ENCOUNTER — Encounter
Admission: EM | Disposition: A | Discharge status: Home / Self Care | Payer: Self-pay | Source: Home / Self Care | Attending: Internal Medicine

## 2023-05-28 ENCOUNTER — Encounter: Payer: Self-pay | Admitting: Internal Medicine

## 2023-05-28 DIAGNOSIS — Z7901 Long term (current) use of anticoagulants: Secondary | ICD-10-CM | POA: Diagnosis not present

## 2023-05-28 DIAGNOSIS — F4310 Post-traumatic stress disorder, unspecified: Secondary | ICD-10-CM | POA: Diagnosis present

## 2023-05-28 DIAGNOSIS — K922 Gastrointestinal hemorrhage, unspecified: Secondary | ICD-10-CM | POA: Diagnosis not present

## 2023-05-28 DIAGNOSIS — K625 Hemorrhage of anus and rectum: Secondary | ICD-10-CM | POA: Diagnosis present

## 2023-05-28 DIAGNOSIS — F319 Bipolar disorder, unspecified: Secondary | ICD-10-CM | POA: Diagnosis present

## 2023-05-28 DIAGNOSIS — Z5900 Homelessness unspecified: Secondary | ICD-10-CM | POA: Diagnosis not present

## 2023-05-28 DIAGNOSIS — F1210 Cannabis abuse, uncomplicated: Secondary | ICD-10-CM | POA: Diagnosis present

## 2023-05-28 DIAGNOSIS — N50811 Right testicular pain: Secondary | ICD-10-CM | POA: Diagnosis not present

## 2023-05-28 DIAGNOSIS — F17210 Nicotine dependence, cigarettes, uncomplicated: Secondary | ICD-10-CM

## 2023-05-28 DIAGNOSIS — K5730 Diverticulosis of large intestine without perforation or abscess without bleeding: Secondary | ICD-10-CM | POA: Diagnosis not present

## 2023-05-28 DIAGNOSIS — E669 Obesity, unspecified: Secondary | ICD-10-CM | POA: Diagnosis present

## 2023-05-28 DIAGNOSIS — Z79899 Other long term (current) drug therapy: Secondary | ICD-10-CM

## 2023-05-28 DIAGNOSIS — Z5941 Food insecurity: Secondary | ICD-10-CM | POA: Diagnosis not present

## 2023-05-28 DIAGNOSIS — I871 Compression of vein: Secondary | ICD-10-CM | POA: Diagnosis not present

## 2023-05-28 DIAGNOSIS — R911 Solitary pulmonary nodule: Secondary | ICD-10-CM | POA: Diagnosis not present

## 2023-05-28 DIAGNOSIS — J432 Centrilobular emphysema: Secondary | ICD-10-CM | POA: Diagnosis not present

## 2023-05-28 DIAGNOSIS — Z6833 Body mass index (BMI) 33.0-33.9, adult: Secondary | ICD-10-CM | POA: Diagnosis not present

## 2023-05-28 DIAGNOSIS — I2693 Single subsegmental pulmonary embolism without acute cor pulmonale: Secondary | ICD-10-CM | POA: Diagnosis present

## 2023-05-28 DIAGNOSIS — K921 Melena: Secondary | ICD-10-CM | POA: Diagnosis not present

## 2023-05-28 DIAGNOSIS — F1290 Cannabis use, unspecified, uncomplicated: Secondary | ICD-10-CM

## 2023-05-28 DIAGNOSIS — K8070 Calculus of gallbladder and bile duct without cholecystitis without obstruction: Secondary | ICD-10-CM | POA: Diagnosis present

## 2023-05-28 DIAGNOSIS — M79605 Pain in left leg: Secondary | ICD-10-CM | POA: Diagnosis not present

## 2023-05-28 DIAGNOSIS — F17290 Nicotine dependence, other tobacco product, uncomplicated: Secondary | ICD-10-CM | POA: Diagnosis not present

## 2023-05-28 DIAGNOSIS — Z452 Encounter for adjustment and management of vascular access device: Secondary | ICD-10-CM | POA: Diagnosis not present

## 2023-05-28 DIAGNOSIS — Z800 Family history of malignant neoplasm of digestive organs: Secondary | ICD-10-CM | POA: Diagnosis not present

## 2023-05-28 DIAGNOSIS — K8050 Calculus of bile duct without cholangitis or cholecystitis without obstruction: Secondary | ICD-10-CM | POA: Diagnosis not present

## 2023-05-28 DIAGNOSIS — I2699 Other pulmonary embolism without acute cor pulmonale: Secondary | ICD-10-CM | POA: Diagnosis not present

## 2023-05-28 DIAGNOSIS — Z86711 Personal history of pulmonary embolism: Secondary | ICD-10-CM | POA: Diagnosis not present

## 2023-05-28 DIAGNOSIS — R109 Unspecified abdominal pain: Secondary | ICD-10-CM | POA: Diagnosis not present

## 2023-05-28 DIAGNOSIS — Z5982 Transportation insecurity: Secondary | ICD-10-CM | POA: Diagnosis not present

## 2023-05-28 HISTORY — PX: IVC FILTER INSERTION: CATH118245

## 2023-05-28 HISTORY — DX: Melena: K92.1

## 2023-05-28 LAB — BASIC METABOLIC PANEL WITH GFR
Anion gap: 8 (ref 5–15)
BUN: 12 mg/dL (ref 6–20)
CO2: 25 mmol/L (ref 22–32)
Calcium: 8.8 mg/dL — ABNORMAL LOW (ref 8.9–10.3)
Chloride: 106 mmol/L (ref 98–111)
Creatinine, Ser: 0.73 mg/dL (ref 0.61–1.24)
GFR, Estimated: >60 mL/min (ref >60)
Glucose, Bld: 87 mg/dL (ref 70–99)
Potassium: 3.8 mmol/L (ref 3.5–5.1)
Sodium: 139 mmol/L (ref 135–145)

## 2023-05-28 LAB — CBC
HCT: 38.9 % — ABNORMAL LOW (ref 39.0–52.0)
HCT: 40.8 % (ref 39.0–52.0)
HCT: 40.5 % (ref 39.0–52.0)
Hemoglobin: 12.7 g/dL — ABNORMAL LOW (ref 13.0–17.0)
Hemoglobin: 13.3 g/dL (ref 13.0–17.0)
Hemoglobin: 13.3 g/dL (ref 13.0–17.0)
MCH: 28.9 pg (ref 26.0–34.0)
MCH: 28.6 pg (ref 26.0–34.0)
MCH: 28.7 pg (ref 26.0–34.0)
MCHC: 32.6 g/dL (ref 30.0–36.0)
MCHC: 32.6 g/dL (ref 30.0–36.0)
MCHC: 32.8 g/dL (ref 30.0–36.0)
MCV: 88.6 fL (ref 80.0–100.0)
MCV: 87.7 fL (ref 80.0–100.0)
MCV: 87.5 fL (ref 80.0–100.0)
Platelets: 176 10*3/uL (ref 150–400)
Platelets: 182 10*3/uL (ref 150–400)
Platelets: 179 10*3/uL (ref 150–400)
RBC: 4.39 MIL/uL (ref 4.22–5.81)
RBC: 4.65 MIL/uL (ref 4.22–5.81)
RBC: 4.63 MIL/uL (ref 4.22–5.81)
RDW: 13.4 % (ref 11.5–15.5)
RDW: 13.4 % (ref 11.5–15.5)
RDW: 13.3 % (ref 11.5–15.5)
WBC: 4.3 10*3/uL (ref 4.0–10.5)
WBC: 3.6 10*3/uL — ABNORMAL LOW (ref 4.0–10.5)
WBC: 4.2 10*3/uL (ref 4.0–10.5)
nRBC: 0 % (ref 0.0–0.2)
nRBC: 0 % (ref 0.0–0.2)
nRBC: 0 % (ref 0.0–0.2)

## 2023-05-28 LAB — APTT: aPTT: 35 s (ref 24–36)

## 2023-05-28 LAB — TROPONIN I (HIGH SENSITIVITY): Troponin I (High Sensitivity): 5 ng/L (ref <18)

## 2023-05-28 LAB — D-DIMER, QUANTITATIVE: D-Dimer, Quant: 0.62 ug{FEU}/mL — ABNORMAL HIGH (ref 0.00–0.50)

## 2023-05-28 LAB — PROTIME-INR
INR: 1.3 — ABNORMAL HIGH (ref 0.8–1.2)
Prothrombin Time: 16.2 s — ABNORMAL HIGH (ref 11.4–15.2)

## 2023-05-28 SURGERY — TEMPORARY DIALYSIS CATHETER
Anesthesia: Moderate Sedation

## 2023-05-28 SURGERY — IVC FILTER INSERTION
Anesthesia: Moderate Sedation

## 2023-05-28 MED ORDER — MIDAZOLAM HCL 2 MG/ML PO SYRP
ORAL_SOLUTION | ORAL | Status: AC
Start: 2023-05-28 — End: ?
  Filled 2023-05-28: qty 5

## 2023-05-28 MED ORDER — CEFAZOLIN SODIUM-DEXTROSE 2-4 GM/100ML-% IV SOLN
2.0000 g | INTRAVENOUS | Status: AC
  Administered 2023-05-28: 2 g via INTRAVENOUS

## 2023-05-28 MED ORDER — LIDOCAINE-EPINEPHRINE (PF) 1 %-1:200000 IJ SOLN
INTRAMUSCULAR | Status: DC | PRN
  Administered 2023-05-28: 10 mL

## 2023-05-28 MED ORDER — IODIXANOL 320 MG/ML IV SOLN
INTRAVENOUS | Status: DC | PRN
  Administered 2023-05-28: 10 mL

## 2023-05-28 MED ORDER — MIDAZOLAM HCL 2 MG/ML PO SYRP
8.0000 mg | ORAL_SOLUTION | Freq: Once | ORAL | Status: AC | PRN
  Administered 2023-05-28: 8 mg via ORAL

## 2023-05-28 MED ORDER — MIDAZOLAM HCL 5 MG/5ML IJ SOLN
INTRAMUSCULAR | Status: AC
  Filled 2023-05-28: qty 5

## 2023-05-28 MED ORDER — SODIUM CHLORIDE 0.9 % IV SOLN: INTRAVENOUS | Status: DC

## 2023-05-28 MED ORDER — HEPARIN SODIUM (PORCINE) 1000 UNIT/ML IJ SOLN
INTRAMUSCULAR | Status: AC
  Filled 2023-05-28: qty 10

## 2023-05-28 MED ORDER — HEPARIN (PORCINE) IN NACL 1000-0.9 UT/500ML-% IV SOLN
INTRAVENOUS | Status: DC | PRN
  Administered 2023-05-28: 1000 mL

## 2023-05-28 MED ORDER — FENTANYL CITRATE (PF) 100 MCG/2ML IJ SOLN
INTRAMUSCULAR | Status: DC | PRN
  Administered 2023-05-28: 50 ug via INTRAVENOUS

## 2023-05-28 MED ORDER — MIDAZOLAM HCL 2 MG/2ML IJ SOLN
INTRAMUSCULAR | Status: DC | PRN
Start: 2023-05-28 — End: 2023-05-28
  Administered 2023-05-28: 2 mg via INTRAVENOUS

## 2023-05-28 MED ORDER — CEFAZOLIN SODIUM-DEXTROSE 2-4 GM/100ML-% IV SOLN
INTRAVENOUS | Status: AC
  Filled 2023-05-28: qty 100

## 2023-05-28 MED ORDER — METHYLPREDNISOLONE SODIUM SUCC 125 MG IJ SOLR: 125.0000 mg | Freq: Once | INTRAMUSCULAR | Status: DC | PRN

## 2023-05-28 MED ORDER — FENTANYL CITRATE PF 50 MCG/ML IJ SOSY
PREFILLED_SYRINGE | INTRAMUSCULAR | Status: AC
  Filled 2023-05-28: qty 1

## 2023-05-28 MED ORDER — FAMOTIDINE 20 MG PO TABS: 40.0000 mg | ORAL_TABLET | Freq: Once | ORAL | Status: DC | PRN

## 2023-05-28 MED ORDER — DIPHENHYDRAMINE HCL 50 MG/ML IJ SOLN: 50.0000 mg | Freq: Once | INTRAMUSCULAR | Status: DC | PRN

## 2023-05-28 SURGICAL SUPPLY — 4 items
COVER PROBE ULTRASOUND 5X96 (MISCELLANEOUS) IMPLANT
KIT FEM OPTION ELITE FILTER (Filter) IMPLANT
PACK ANGIOGRAPHY (CUSTOM PROCEDURE TRAY) ×1 IMPLANT
WIRE SUPRACORE 190CM (WIRE) IMPLANT

## 2023-05-28 NOTE — Consult Note (Signed)
 Hospital Consult    Reason for Consult: Inferior vena cava filter placement Requesting Physician:  Dr Fidencio Hue MD  MRN #:  [id number]  History of Present Illness: This is a 38 y.o. male with medical history significant of homeless, tobacco abuse, marijuana abuse, bipolar, PTSD, obesity, recent admission due to PE and cholelithiasis and choledocholithiasis, who presents with abdominal pain, diarrhea with dark stool.   Patient was recently hospitalized from 4/26 - 4/29. Pt was found to have acute PE.  Lower extremity venous Doppler was negative for DVT.  Patient was started on Eliquis . Pt also had cholelithiasis and choledocholithiasis. He was s/p ERCP with biliary sphincterectomy and balloon extraction on 05/24/2023. Pt had mid common bile duct measures up to 8 mm. He was evaluated by general surgeon.  Outpatient follow-up was recommended for for elective cholecystectomy. Pt state that he has worsening abdominal pain today, associated with nausea and diarrhea with black stool.  No vomiting.  He has had 3 episode of diarrhea with dark stool.  His abdominal pain is located in the upper abdomen, constant, sharp, moderate to severe, nonradiating.  Patient has poor appetite and decreased oral intake.  No fever or chills.  Patient does not have chest pain, cough, SOB.  No symptoms of UTI. Patient took last dose of Eliquis  yesterday morning. Vascular Surgery consulted to evaluate.    Past Medical History:  Diagnosis Date   Seizures [facility])     Past Surgical History:  Procedure Laterality Date   ERCP N/A 05/24/2023   Procedure: ERCP, WITH INTERVENTION IF INDICATED;  Surgeon: Marnee Sink, MD;  Location: [facility] ENDOSCOPY;  Service: Endoscopy;  Laterality: N/A;   FACIAL FRACTURE SURGERY     SPYGLASS LITHOTRIPSY  05/24/2023   Procedure: ERCP, WITH CHOLANGIOSCOPY AND LITHOTRIPSY;  Surgeon: Marnee Sink, MD;  Location: [facility] ENDOSCOPY;  Service: Endoscopy;;    No Known Allergies  Prior to Admission  medications   Medication Sig Start Date End Date Taking? Authorizing Provider  APIXABAN  (ELIQUIS ) VTE STARTER PACK (10MG  AND 5MG ) Take as directed on package: start with two-5mg  tablets twice daily for 7 days. On day 8, switch to one-5mg  tablet twice daily. 05/25/23  Yes Sheril Dines, MD  nicotine  (NICODERM CQ  - DOSED IN MG/24 HOURS) 21 mg/24hr patch Place 1 patch (21 mg total) onto the skin daily. 05/25/23  Yes Sheril Dines, MD  oxyCODONE  (OXY IR/ROXICODONE ) 5 MG immediate release tablet Take 1 tablet (5 mg total) by mouth every 12 (twelve) hours as needed for moderate pain (pain score 4-6). 05/25/23  Yes Sheril Dines, MD  prazosin  (MINIPRESS ) 1 MG capsule Take 1 capsule (1 mg total) by mouth at bedtime. 05/25/23  Yes Sheril Dines, MD  sertraline  (ZOLOFT ) 25 MG tablet Take 1 tablet (25 mg total) by mouth daily. 05/25/23  Yes Sheril Dines, MD    Social History   Socioeconomic History   Marital status: Single    Spouse name: Not on file   Number of children: Not on file   Years of education: Not on file   Highest education level: Not on file  Occupational History   Not on file  Tobacco Use   Smoking status: Every Day    Types: Cigarettes   Smokeless tobacco: Never  Substance and Sexual Activity   Alcohol use: No   Drug use: Yes    Types: Marijuana    Comment: molly last week   Sexual activity: Not on file  Other Topics Concern   Not on file  Social History Narrative   ** Merged History Encounter **       Social Drivers of Health   Financial Resource Strain: Not on file  Food Insecurity: Food Insecurity Present (05/28/2023)   Hunger Vital Sign    Worried About Running Out of Food in the Last Year: Sometimes true    Ran Out of Food in the Last Year: Sometimes true  Transportation Needs: No Transportation Needs (05/28/2023)   PRAPARE - Administrator, Civil Service (Medical): No    Lack of Transportation (Non-Medical): No  Recent Concern: Transportation Needs -  Unmet Transportation Needs (05/22/2023)   PRAPARE - Administrator, Civil Service (Medical): Yes    Lack of Transportation (Non-Medical): Yes  Physical Activity: Not on file  Stress: Not on file  Social Connections: Not on file  Intimate Partner Violence: Not At Risk (05/28/2023)   Humiliation, Afraid, Rape, and Kick questionnaire    Fear of Current or Ex-Partner: No    Emotionally Abused: No    Physically Abused: No    Sexually Abused: No     No family history on file.  ROS: Otherwise negative unless mentioned in HPI  Physical Examination  Vitals:   05/28/23 0336 05/28/23 0814  BP: 122/74 124/76  Pulse: (!) 51 (!) 51  Resp: 18 16  Temp: 98.2 F (36.8 C) (!) 97.5 F (36.4 C)  SpO2: 99% 100%   Body mass index is 33.79 kg/m.  General:  WDWN in NAD Gait: Not observed HENT: WNL, normocephalic Pulmonary: normal non-labored breathing, without Rales, rhonchi,  wheezing Cardiac: regular, without  Murmurs, rubs or gallops; without carotid bruits Abdomen: Positive bowel sounds throughout, soft, Positive tenderness/ND, no masses. Positive generalized pain.  Skin: without rashes Vascular Exam/Pulses: Palpable Pulses throughout,  Extremities: without ischemic changes, without Gangrene , without cellulitis; without open wounds;  Musculoskeletal: no muscle wasting or atrophy  Neurologic: A&O X 3;  No focal weakness or paresthesias are detected; speech is fluent/normal Psychiatric:  The pt has Normal affect. Lymph:  Unremarkable  CBC    Component Value Date/Time   WBC 3.6 (L) 05/28/2023 0725   RBC 4.65 05/28/2023 0725   HGB 13.3 05/28/2023 0725   HCT 40.8 05/28/2023 0725   PLT 182 05/28/2023 0725   MCV 87.7 05/28/2023 0725   MCH 28.6 05/28/2023 0725   MCHC 32.6 05/28/2023 0725   RDW 13.4 05/28/2023 0725   LYMPHSABS 1.1 05/27/2023 1326   MONOABS 0.3 05/27/2023 1326   EOSABS 0.0 05/27/2023 1326   BASOSABS 0.0 05/27/2023 1326    BMET    Component Value  Date/Time   NA 139 05/28/2023 0725   K 3.8 05/28/2023 0725   CL 106 05/28/2023 0725   CO2 25 05/28/2023 0725   GLUCOSE 87 05/28/2023 0725   BUN 12 05/28/2023 0725   CREATININE 0.73 05/28/2023 0725   CALCIUM 8.8 (L) 05/28/2023 0725   GFRNONAA >60 05/28/2023 0725   GFRAA >60 08/10/2018 1057    COAGS: Lab Results  Component Value Date   INR 1.3 (H) 05/28/2023   INR 1.0 05/22/2023     Non-Invasive Vascular Imaging:   EXAM:05/28/23 CT ANGIOGRAPHY CHEST WITH CONTRAST   TECHNIQUE: Multidetector CT imaging of the chest was performed using the standard protocol during bolus administration of intravenous contrast. Multiplanar CT image reconstructions and MIPs were obtained to evaluate the vascular anatomy.   RADIATION DOSE REDUCTION: This exam was performed according to the departmental dose-optimization program which includes automated  exposure control, adjustment of the mA and/or kV according to patient size and/or use of iterative reconstruction technique.   CONTRAST:  75mL OMNIPAQUE  IOHEXOL  350 MG/ML SOLN   COMPARISON:  09/01/2016 chest radiograph from [place]. No comparison CT.   FINDINGS: Cardiovascular: The quality of this exam for evaluation of pulmonary embolism is sufficient. Isolated filling defect within the posterior right upper lobe segmental and subsegmental pulmonary artery branches, relatively occlusive including on 136/9 and coronal image 78.   Normal aortic caliber. Mild cardiomegaly, without pericardial effusion.   Mediastinum/Nodes: Borderline to mild right hilar adenopathy is likely reactive at 1.4 cm. No mediastinal or left hilar adenopathy.   Lungs/Pleura: Trace right pleural fluid. Mild paraseptal and centrilobular emphysema.   Posterior right upper lobe pleural-based airspace opacity including on 34/8.   3 mm pleural-based left upper lobe pulmonary nodule on 31/8.   Upper Abdomen: Multiple small gallstones. Focal steatosis adjacent the  falciform ligament. Normal imaged portions of the spleen, stomach, pancreas, adrenal glands, kidneys   Although there is no significant common duct dilatation, choledocholithiasis is seen including stones of up to 5 mm on 146/7 and coronal image 63.   Musculoskeletal: No acute osseous abnormality. Minimal convex left thoracic spine curvature.   Review of the MIP images confirms the above findings.   IMPRESSION: 1. Posterior right upper lobe segmental to subsegmental pulmonary embolism, likely occlusive. Posterior right upper lobe infarct. 2. Trace right pleural fluid 3. Cholelithiasis. Choledocholithiasis without significant biliary duct dilatation. 4. Emphysema (ICD10-J43.9). left upper lobe 3 mm pulmonary nodule. No follow-up needed if patient is low-risk. Non-contrast chest CT can be considered in 12 months if patient is high-risk, nodule is upper lobe, and/or suspicious in morphology. This recommendation follows the consensus statement: Guidelines for Management of Incidental Pulmonary Nodules Detected on CT Images: From the Fleischner Society 2017; Radiology 2017; 284:228-243.   Critical test results telephoned to . Dr. Cleora Daft. At the time of interpretation at . 3:45 p.m.On . 05/22/2023.  Statin:  No. Beta Blocker:  No. Aspirin:  No. ACEI:  No. ARB:  No. CCB use:  No Other antiplatelets/anticoagulants:  Yes.   Eliquis  5 mg BID   ASSESSMENT/PLAN: This is a 38 y.o. male with medical history significant of homeless, tobacco abuse, marijuana abuse, bipolar, PTSD, obesity, recent admission due to PE and cholelithiasis and choledocholithiasis, who presents with abdominal pain, diarrhea with dark stool.   Vascular surgery recommends IVC filter placement due to the patient's GI bleeding and inability to be anticoagulated.  Therefore vascular surgery plans on taking the patient to the vascular lab on 05/28/2023 for an inferior vena cava filter placement.  I had a long discussion at  the bedside this morning with the patient regarding the procedure, benefits, risk, complications.  Patient verbalized understanding and wishes to proceed.  Patient has been n.p.o. since midnight last night.  I answered all the patient's questions today.  Patient currently is not on any anticoagulation due to GI bleeding.   - I discussed the case in detail with Dr. Mikki Alexander MD and he agrees with the plan.   Annamaria Barrette [facility] 05/28/2023 11:38 AM

## 2023-05-28 NOTE — Plan of Care (Signed)

## 2023-05-28 NOTE — Plan of Care (Signed)
   Problem: Education: Goal: Knowledge of General Education information will improve Description: Including pain rating scale, medication(s)/side effects and non-pharmacologic comfort measures Outcome: Progressing   Problem: Clinical Measurements: Goal: Will remain free from infection Outcome: Progressing   Problem: Activity: Goal: Risk for activity intolerance will decrease Outcome: Progressing

## 2023-05-28 NOTE — Progress Notes (Addendum)
 Notified physician by secure chat that patient c/o epigastric discomfort but declined any pain medication or "gas" medication until he is seen by physician. Physician stated she was rounding at this time. Patient tells case manager he is having chest pain (1200). Nurse messaged physician again (no answer on cell # listed) that patient is now c/o chest pain. VSS, awaiting reply. Surgery came in during this time and discussed IVC filter with patient to be done later this afternoon.

## 2023-05-28 NOTE — Progress Notes (Signed)
 Gauze dressing to right groin remains CDI, area soft.

## 2023-05-28 NOTE — H&P (View-Only) (Signed)
 Hospital Consult    Reason for Consult: Inferior vena cava filter placement Requesting Physician:  Dr Fidencio Hue MD  MRN #:  [id number]  History of Present Illness: This is a 38 y.o. male with medical history significant of homeless, tobacco abuse, marijuana abuse, bipolar, PTSD, obesity, recent admission due to PE and cholelithiasis and choledocholithiasis, who presents with abdominal pain, diarrhea with dark stool.   Patient was recently hospitalized from 4/26 - 4/29. Pt was found to have acute PE.  Lower extremity venous Doppler was negative for DVT.  Patient was started on Eliquis . Pt also had cholelithiasis and choledocholithiasis. He was s/p ERCP with biliary sphincterectomy and balloon extraction on 05/24/2023. Pt had mid common bile duct measures up to 8 mm. He was evaluated by general surgeon.  Outpatient follow-up was recommended for for elective cholecystectomy. Pt state that he has worsening abdominal pain today, associated with nausea and diarrhea with black stool.  No vomiting.  He has had 3 episode of diarrhea with dark stool.  His abdominal pain is located in the upper abdomen, constant, sharp, moderate to severe, nonradiating.  Patient has poor appetite and decreased oral intake.  No fever or chills.  Patient does not have chest pain, cough, SOB.  No symptoms of UTI. Patient took last dose of Eliquis  yesterday morning. Vascular Surgery consulted to evaluate.    Past Medical History:  Diagnosis Date   Seizures [facility])     Past Surgical History:  Procedure Laterality Date   ERCP N/A 05/24/2023   Procedure: ERCP, WITH INTERVENTION IF INDICATED;  Surgeon: Marnee Sink, MD;  Location: [facility] ENDOSCOPY;  Service: Endoscopy;  Laterality: N/A;   FACIAL FRACTURE SURGERY     SPYGLASS LITHOTRIPSY  05/24/2023   Procedure: ERCP, WITH CHOLANGIOSCOPY AND LITHOTRIPSY;  Surgeon: Marnee Sink, MD;  Location: [facility] ENDOSCOPY;  Service: Endoscopy;;    No Known Allergies  Prior to Admission  medications   Medication Sig Start Date End Date Taking? Authorizing Provider  APIXABAN  (ELIQUIS ) VTE STARTER PACK (10MG  AND 5MG ) Take as directed on package: start with two-5mg  tablets twice daily for 7 days. On day 8, switch to one-5mg  tablet twice daily. 05/25/23  Yes Sheril Dines, MD  nicotine  (NICODERM CQ  - DOSED IN MG/24 HOURS) 21 mg/24hr patch Place 1 patch (21 mg total) onto the skin daily. 05/25/23  Yes Sheril Dines, MD  oxyCODONE  (OXY IR/ROXICODONE ) 5 MG immediate release tablet Take 1 tablet (5 mg total) by mouth every 12 (twelve) hours as needed for moderate pain (pain score 4-6). 05/25/23  Yes Sheril Dines, MD  prazosin  (MINIPRESS ) 1 MG capsule Take 1 capsule (1 mg total) by mouth at bedtime. 05/25/23  Yes Sheril Dines, MD  sertraline  (ZOLOFT ) 25 MG tablet Take 1 tablet (25 mg total) by mouth daily. 05/25/23  Yes Sheril Dines, MD    Social History   Socioeconomic History   Marital status: Single    Spouse name: Not on file   Number of children: Not on file   Years of education: Not on file   Highest education level: Not on file  Occupational History   Not on file  Tobacco Use   Smoking status: Every Day    Types: Cigarettes   Smokeless tobacco: Never  Substance and Sexual Activity   Alcohol use: No   Drug use: Yes    Types: Marijuana    Comment: molly last week   Sexual activity: Not on file  Other Topics Concern   Not on file  Social History Narrative   ** Merged History Encounter **       Social Drivers of Health   Financial Resource Strain: Not on file  Food Insecurity: Food Insecurity Present (05/28/2023)   Hunger Vital Sign    Worried About Running Out of Food in the Last Year: Sometimes true    Ran Out of Food in the Last Year: Sometimes true  Transportation Needs: No Transportation Needs (05/28/2023)   PRAPARE - Administrator, Civil Service (Medical): No    Lack of Transportation (Non-Medical): No  Recent Concern: Transportation Needs -  Unmet Transportation Needs (05/22/2023)   PRAPARE - Administrator, Civil Service (Medical): Yes    Lack of Transportation (Non-Medical): Yes  Physical Activity: Not on file  Stress: Not on file  Social Connections: Not on file  Intimate Partner Violence: Not At Risk (05/28/2023)   Humiliation, Afraid, Rape, and Kick questionnaire    Fear of Current or Ex-Partner: No    Emotionally Abused: No    Physically Abused: No    Sexually Abused: No     No family history on file.  ROS: Otherwise negative unless mentioned in HPI  Physical Examination  Vitals:   05/28/23 0336 05/28/23 0814  BP: 122/74 124/76  Pulse: (!) 51 (!) 51  Resp: 18 16  Temp: 98.2 F (36.8 C) (!) 97.5 F (36.4 C)  SpO2: 99% 100%   Body mass index is 33.79 kg/m.  General:  WDWN in NAD Gait: Not observed HENT: WNL, normocephalic Pulmonary: normal non-labored breathing, without Rales, rhonchi,  wheezing Cardiac: regular, without  Murmurs, rubs or gallops; without carotid bruits Abdomen: Positive bowel sounds throughout, soft, Positive tenderness/ND, no masses. Positive generalized pain.  Skin: without rashes Vascular Exam/Pulses: Palpable Pulses throughout,  Extremities: without ischemic changes, without Gangrene , without cellulitis; without open wounds;  Musculoskeletal: no muscle wasting or atrophy  Neurologic: A&O X 3;  No focal weakness or paresthesias are detected; speech is fluent/normal Psychiatric:  The pt has Normal affect. Lymph:  Unremarkable  CBC    Component Value Date/Time   WBC 3.6 (L) 05/28/2023 0725   RBC 4.65 05/28/2023 0725   HGB 13.3 05/28/2023 0725   HCT 40.8 05/28/2023 0725   PLT 182 05/28/2023 0725   MCV 87.7 05/28/2023 0725   MCH 28.6 05/28/2023 0725   MCHC 32.6 05/28/2023 0725   RDW 13.4 05/28/2023 0725   LYMPHSABS 1.1 05/27/2023 1326   MONOABS 0.3 05/27/2023 1326   EOSABS 0.0 05/27/2023 1326   BASOSABS 0.0 05/27/2023 1326    BMET    Component Value  Date/Time   NA 139 05/28/2023 0725   K 3.8 05/28/2023 0725   CL 106 05/28/2023 0725   CO2 25 05/28/2023 0725   GLUCOSE 87 05/28/2023 0725   BUN 12 05/28/2023 0725   CREATININE 0.73 05/28/2023 0725   CALCIUM 8.8 (L) 05/28/2023 0725   GFRNONAA >60 05/28/2023 0725   GFRAA >60 08/10/2018 1057    COAGS: Lab Results  Component Value Date   INR 1.3 (H) 05/28/2023   INR 1.0 05/22/2023     Non-Invasive Vascular Imaging:   EXAM:05/28/23 CT ANGIOGRAPHY CHEST WITH CONTRAST   TECHNIQUE: Multidetector CT imaging of the chest was performed using the standard protocol during bolus administration of intravenous contrast. Multiplanar CT image reconstructions and MIPs were obtained to evaluate the vascular anatomy.   RADIATION DOSE REDUCTION: This exam was performed according to the departmental dose-optimization program which includes automated  exposure control, adjustment of the mA and/or kV according to patient size and/or use of iterative reconstruction technique.   CONTRAST:  75mL OMNIPAQUE  IOHEXOL  350 MG/ML SOLN   COMPARISON:  09/01/2016 chest radiograph from [facility]. No comparison CT.   FINDINGS: Cardiovascular: The quality of this exam for evaluation of pulmonary embolism is sufficient. Isolated filling defect within the posterior right upper lobe segmental and subsegmental pulmonary artery branches, relatively occlusive including on 136/9 and coronal image 78.   Normal aortic caliber. Mild cardiomegaly, without pericardial effusion.   Mediastinum/Nodes: Borderline to mild right hilar adenopathy is likely reactive at 1.4 cm. No mediastinal or left hilar adenopathy.   Lungs/Pleura: Trace right pleural fluid. Mild paraseptal and centrilobular emphysema.   Posterior right upper lobe pleural-based airspace opacity including on 34/8.   3 mm pleural-based left upper lobe pulmonary nodule on 31/8.   Upper Abdomen: Multiple small gallstones. Focal steatosis adjacent the  falciform ligament. Normal imaged portions of the spleen, stomach, pancreas, adrenal glands, kidneys   Although there is no significant common duct dilatation, choledocholithiasis is seen including stones of up to 5 mm on 146/7 and coronal image 63.   Musculoskeletal: No acute osseous abnormality. Minimal convex left thoracic spine curvature.   Review of the MIP images confirms the above findings.   IMPRESSION: 1. Posterior right upper lobe segmental to subsegmental pulmonary embolism, likely occlusive. Posterior right upper lobe infarct. 2. Trace right pleural fluid 3. Cholelithiasis. Choledocholithiasis without significant biliary duct dilatation. 4. Emphysema (ICD10-J43.9). left upper lobe 3 mm pulmonary nodule. No follow-up needed if patient is low-risk. Non-contrast chest CT can be considered in 12 months if patient is high-risk, nodule is upper lobe, and/or suspicious in morphology. This recommendation follows the consensus statement: Guidelines for Management of Incidental Pulmonary Nodules Detected on CT Images: From the Fleischner Society 2017; Radiology 2017; 284:228-243.   Critical test results telephoned to . Dr. Cleora Daft. At the time of interpretation at . 3:45 p.m.On . 05/22/2023.  Statin:  No. Beta Blocker:  No. Aspirin:  No. ACEI:  No. ARB:  No. CCB use:  No Other antiplatelets/anticoagulants:  Yes.   Eliquis  5 mg BID   ASSESSMENT/PLAN: This is a 38 y.o. male with medical history significant of homeless, tobacco abuse, marijuana abuse, bipolar, PTSD, obesity, recent admission due to PE and cholelithiasis and choledocholithiasis, who presents with abdominal pain, diarrhea with dark stool.   Vascular surgery recommends IVC filter placement due to the patient's GI bleeding and inability to be anticoagulated.  Therefore vascular surgery plans on taking the patient to the vascular lab on 05/28/2023 for an inferior vena cava filter placement.  I had a long discussion at  the bedside this morning with the patient regarding the procedure, benefits, risk, complications.  Patient verbalized understanding and wishes to proceed.  Patient has been n.p.o. since midnight last night.  I answered all the patient's questions today.  Patient currently is not on any anticoagulation due to GI bleeding.   - I discussed the case in detail with Dr. Mikki Alexander MD and he agrees with the plan.   Annamaria Barrette [facility] 05/28/2023 11:38 AM

## 2023-05-28 NOTE — Interval H&P Note (Signed)
 History and Physical Interval Note:  05/28/2023 1:49 PM  Todd Ware  has presented today for surgery, with the diagnosis of PE, GI bleed.  The various methods of treatment have been discussed with the patient and family. After consideration of risks, benefits and other options for treatment, the patient has consented to  Procedure(s): IVC FILTER INSERTION (N/A) as a surgical intervention.  The patient's history has been reviewed, patient examined, no change in status, stable for surgery.  I have reviewed the patient's chart and labs.  Questions were answered to the patient's satisfaction.     [name]

## 2023-05-28 NOTE — Consult Note (Signed)
 GI Inpatient Consult Note  Reason for Consult: Melena/GIB   Attending Requesting Consult: Dr. Fidencio Hue, MD  History of Present Illness: Todd Ware is a 38 y.o. male seen for evaluation of melena/GIB at the request of admitting hospitalist - Dr. Xilin Niu. Patient has a PMH of homelessness, tobacco abuse, marijuana abuse, Bipolar I disorder, PTSD, hx of seizures, obesity (BMI 33), and recent hospital admission for pulmonary embolism and cholelithiasis/choledocholithiasis s/p ERCP with biliary sphincterotomy and balloon extraction 05/24/2023. He presented to the [facility] ED 5/1 for chief complaint of acute upper abdominal pain and 3 episodes of melanotic stools. His last dose of Eliquis  was morning of arrival. Upon presentation to the ED, all vital signs were stable. Labs significant for hemoglobin 14.1, normal serum electrolytes, normal LFTs, and lipase 20. RUQ US  commented on cholelithiasis without acute cholecystitis with CBD 6.6 mm. Per ED physician, hemoccult was positive. CTA abd/pelvis with and without contrast showed no evidence of active GI bleeding. He was started on IV fluids, given Zofran  for nausea, and started on IV Protonix . Vascular Surgery was consulted to consider IVC filter placement. GI consulted for work-up of GIB.   Patient seen and examined this afternoon resting comfortably in hospital bed. No acute events overnight. He is s/p IVC filter placement this afternoon with Dr. Vonna Guardian. Hemoglobin this afternoon before procedure was within normal limits at 13.3. He has a little discomfort in his right groin from the incision. He denies any abdominal pain. He reports last episode of black stool was this morning at 4 AM. He reports total of 4 episodes of liquid black stool since symptom onset 2 days ago. Last dose of Eliquis  was yesterday morning. He denies any overt hematochezia. He denies any nausea or vomiting. He reports prior to admission last week he did do ibuprofen  800 mg several  times a day for a few days. No previous endoscopy.    Past Medical History:  Past Medical History:  Diagnosis Date   Seizures (HCC)     Problem List: Patient Active Problem List   Diagnosis Date Noted   Abdominal pain 05/27/2023   Obesity (BMI 30-39.9) 05/27/2023   Homeless 05/27/2023   Rectal bleeding 05/27/2023   Cholelithiasis 05/27/2023   Diarrhea 05/27/2023   Pulmonary embolism (HCC) 05/22/2023   Choledocholithiasis 05/22/2023   Tobacco abuse 05/22/2023   Polysubstance abuse (HCC) 05/22/2023   Bipolar depression (HCC) 08/11/2018   Bipolar 1 disorder with moderate mania (HCC) 08/10/2018    Past Surgical History: Past Surgical History:  Procedure Laterality Date   ERCP N/A 05/24/2023   Procedure: ERCP, WITH INTERVENTION IF INDICATED;  Surgeon: Marnee Sink, MD;  Location: [facility] ENDOSCOPY;  Service: Endoscopy;  Laterality: N/A;   FACIAL FRACTURE SURGERY     SPYGLASS LITHOTRIPSY  05/24/2023   Procedure: ERCP, WITH CHOLANGIOSCOPY AND LITHOTRIPSY;  Surgeon: Marnee Sink, MD;  Location: [facility] ENDOSCOPY;  Service: Endoscopy;;    Allergies: No Known Allergies  Home Medications: Medications Prior to Admission  Medication Sig Dispense Refill Last Dose/Taking   APIXABAN  (ELIQUIS ) VTE STARTER PACK (10MG  AND 5MG ) Take as directed on package: start with two-5mg  tablets twice daily for 7 days. On day 8, switch to one-5mg  tablet twice daily. 74 each 0 05/27/2023 Morning   nicotine  (NICODERM CQ  - DOSED IN MG/24 HOURS) 21 mg/24hr patch Place 1 patch (21 mg total) onto the skin daily. 28 patch 0 05/27/2023   oxyCODONE  (OXY IR/ROXICODONE ) 5 MG immediate release tablet Take 1 tablet (5 mg  total) by mouth every 12 (twelve) hours as needed for moderate pain (pain score 4-6). 6 tablet 0 Taking As Needed   prazosin  (MINIPRESS ) 1 MG capsule Take 1 capsule (1 mg total) by mouth at bedtime. 30 capsule 0 05/26/2023   sertraline  (ZOLOFT ) 25 MG tablet Take 1 tablet (25 mg total) by mouth daily. 30 tablet 0  05/27/2023   Home medication reconciliation was completed with the patient.   Scheduled Inpatient Medications:    nicotine   21 mg Transdermal Daily   pantoprazole  (PROTONIX ) IV  40 mg Intravenous Q12H   prazosin   1 mg Oral QHS   sertraline   25 mg Oral Daily    Continuous Inpatient Infusions:    sodium chloride  75 mL/hr at 05/28/23 0658    PRN Inpatient Medications:  acetaminophen , morphine  injection, ondansetron  (ZOFRAN ) IV, oxyCODONE -acetaminophen   Family History: family history is not on file.  The patient's family history is negative for inflammatory bowel disorders, GI malignancy, or solid organ transplantation.  Social History:   reports that he has been smoking cigarettes. He has never used smokeless tobacco. He reports current drug use. Drug: Marijuana. He reports that he does not drink alcohol. The patient denies ETOH, tobacco, or drug use.   Review of Systems: Constitutional: Weight is stable.  Eyes: No changes in vision. ENT: No oral lesions, sore throat.  GI: see HPI.  Heme/Lymph: No easy bruising.  CV: No chest pain.  GU: No hematuria.  Integumentary: No rashes.  Neuro: No headaches.  Psych: No depression/anxiety.  Endocrine: No heat/cold intolerance.  Allergic/Immunologic: No urticaria.  Resp: No cough, SOB.  Musculoskeletal: No joint swelling.    Physical Examination: BP 127/81   Pulse 61   Temp (!) 97.5 F (36.4 C)   Resp 16   Ht 6' (1.829 m)   Wt 113 kg   SpO2 100%   BMI 33.79 kg/m  Gen: NAD, alert and oriented x 4 HEENT: PEERLA, EOMI, Neck: supple, no JVD or thyromegaly Chest: CTA bilaterally, no wheezes, crackles, or other adventitious sounds CV: RRR, no m/g/c/r Abd: soft, NT, ND, +BS in all four quadrants; no HSM, guarding, ridigity, or rebound tenderness Ext: no edema, well perfused with 2+ pulses, Skin: no rash or lesions noted Lymph: no LAD  Data: Lab Results  Component Value Date   WBC 3.6 (L) 05/28/2023   HGB 13.3 05/28/2023    HCT 40.8 05/28/2023   MCV 87.7 05/28/2023   PLT 182 05/28/2023   Recent Labs  Lab 05/27/23 2118 05/28/23 0113 05/28/23 0725  HGB 12.8* 12.7* 13.3   Lab Results  Component Value Date   NA 139 05/28/2023   K 3.8 05/28/2023   CL 106 05/28/2023   CO2 25 05/28/2023   BUN 12 05/28/2023   CREATININE 0.73 05/28/2023   Lab Results  Component Value Date   ALT 35 05/27/2023   AST 38 05/27/2023   ALKPHOS 67 05/27/2023   BILITOT 0.9 05/27/2023   Recent Labs  Lab 05/28/23 0113  APTT 35  INR 1.3*   CTA abd/pelvis with and without contrast 05/27/2023: IMPRESSION: VASCULAR   1. No evidence of active gastrointestinal bleeding. 2. No significant vascular findings.   NON-VASCULAR   1. Cholelithiasis without evidence of acute cholecystitis. Interval removal or passage of the choledocholithiasis seen previously, with resolution of the mild biliary duct dilation noted on prior MRI. 2. Minimal distal colonic diverticulosis without diverticulitis.  RUQ US  05/27/2023: IMPRESSION: 1. Cholelithiasis without acute cholecystitis. 2. Otherwise unremarkable exam.  Assessment/Plan:  38 y/o AA male with a PMH of homelessness, tobacco abuse, marijuana abuse, Bipolar I disorder, PTSD, hx of seizures, obesity (BMI 33), and recent hospital admission for acute PE and cholelithiasis/choledocholithiasis s/p ERCP with biliary sphincterotomy 05/24/23 presented to the [facility] ED 5/1 for chief complaint of acute upper abdominal pain and melena. GI consulted for further evaluation and management.   Melena concerning for GIB  Acute upper abdominal pain  Acute PE s/p IVC filter placement 05/28/2023  Cholelithiasis   Hx of polysubstance abuse  Obesity  Homelessness   Recommendations:  - Hemodynamically stable currently with no evidence of overt GIB - Continue to monitor serial H&H. Transfuse for Hgb <7.0.  - Continue to monitor for signs of GI bleeding - Continue PPI BID for gastric protection  -  Hold Eliquis  at this time - EGD when clinically feasible, ideally after 48 hours off Eliquis . Timing will be determined upon discussion with Dr. Corky Diener. Likely over the weekend.  - Continue regular diet for now. NPO after midnight and will reassess in the morning.  - Dr. Corky Diener will be following over the weekend   I reviewed the risks (including bleeding, perforation, infection, anesthesia complications, cardiac/respiratory complications), benefits and alternatives of EGD. Patient consents to proceed.    Thank you for the consult. Please call with questions or concerns.  Landa Pine, PA-C [facility] [phone]

## 2023-05-28 NOTE — Progress Notes (Signed)
 Patient known to nurse navigator from previous admission. Patient was released from prison about 2 months ago. Unfortunately the halfway house that he was told he was being released to by his prison case manager, denied him. He has been homeless since then.  Patient states he had been staying in a hotel since d/c, as his sister paid for his stay. Describes feeling "very weak," and unable to eat. He also mentions some swelling to his left leg that comes and goes. MD made aware.  During his last hospitalization, I was able to set him up with an establishment of care appointment with a PCP, and a mental health appointment. He was also provided with information for [employer], [employer] and [employer].  Patient is in need of appropriate glasses. Current, prison issued, glasses are broken and not the correct rx. Will get appointment established.  Will also request medical records from [facility].   Encouraged to reach out with any questions or concerns.

## 2023-05-28 NOTE — Progress Notes (Signed)
 Assisted patient to complete new patient paperwork for upcoming appointment with [facility]. Paperwork e-mailed back to Salvatore Creeks per request  Patient also completed release for medical records to be submitted to [facility]. Release and request submitted.

## 2023-05-28 NOTE — Op Note (Signed)
 [facility]   OPERATIVE NOTE    PRE-OPERATIVE DIAGNOSIS: PE, GI bleed  POST-OPERATIVE DIAGNOSIS: same as above  PROCEDURE: 1.   Ultrasound guidance for vascular access to the right femoral vein 2.   Catheter placement into the inferior vena cava 3.   Inferior venacavogram 4.   Placement of a Option Elite IVC filter  SURGEON: Mikki Alexander, MD  ASSISTANT(S): None  ANESTHESIA: local with Moderate Conscious Sedation for approximately 13 minutes using 2 mg of Versed  and 50 mcg of Fentanyl   ESTIMATED BLOOD LOSS: minimal  CONTRAST: 10 cc  FLUORO TIME: less than one minute  FINDING(S): 1.  Patent IVC  SPECIMEN(S):  none  INDICATIONS:   Todd Ware is a 38 y.o. male who presents with a PE and a GI bleed.  Inferior vena cava filter is indicated for this reason.  Risks and benefits including filter thrombosis, migration, fracture, bleeding, and infection were all discussed.  We discussed that all IVC filters that we place can be removed if desired from the patient once the need for the filter has passed.    DESCRIPTION: After obtaining full informed written consent, the patient was brought back to the vascular suite. The skin was sterilely prepped and draped in a sterile surgical field was created. Moderate conscious sedation was administered during a face to face encounter with the patient throughout the procedure with my supervision of the RN administering medicines and monitoring the patient's vital signs, pulse oximetry, telemetry and mental status throughout from the start of the procedure until the patient was taken to the recovery room. The right femoral vein was accessed under direct ultrasound guidance without difficulty with a Seldinger needle and a J-wire was then placed. After skin nick and dilatation, the delivery sheath was placed into the inferior vena cava and an inferior venacavogram was performed. This demonstrated a patent IVC with the level of  the renal veins at the top of L1.  The filter was then deployed into the inferior vena cava at the level of at the L1L2 interspace just below the renal veins. The delivery sheath was then removed. Pressure was held. Sterile dressings were placed. The patient tolerated the procedure well and was taken to the recovery room in stable condition.  COMPLICATIONS: None  CONDITION: Stable  Mikki Alexander  05/28/2023, 2:52 PM   This note was created with Dragon Medical transcription system. Any errors in dictation are purely unintentional.

## 2023-05-28 NOTE — Progress Notes (Signed)
 Progress Note   Patient: Todd Ware UJW:[id number] DOB: 03/01/1985 DOA: 05/27/2023     0 DOS: the patient was seen and examined on 05/28/2023   Brief hospital course: HPI from admission 5/1 PM:  "Todd Ware is a 38 y.o. male with medical history significant of homeless, tobacco abuse, marijuana abuse, bipolar, PTSD, obesity, recent admission due to PE and cholelithiasis and choledocholithiasis, who presents with abdominal pain, diarrhea with dark stool. ..." See H&P for full HPI on admission & ED course.  Of note, pt recently started on Eliquis  for PE during admission 4/26--29. Also during that admission underwent ERCP for choledocholithiasis and was planned for outpatient elective cholecystectomy with surgery in follow-up.  Patient was admitted with GI consulted for evaluation of melena. Vascular surgery consulted for IVC filter placement in the setting of GI bleeding with Eliquis .    Further hospital course and management as outlined below.   Assessment and Plan:  Melena: presented with abdominal pain, diarrhea with liquid dark/black stools.  Hemoglobin stable 14.1 on admission.  CTA negative for active GI bleeding --GI consulted --Plan for EGD after off Eliquis  48 hours --Regular diet, NPO after midnight --Stop Eliquis  & avoid anticoagulants --IV PPI BID --No NSAID's --Maintenance IV fluids --Zofran  IV PRN --Trend Hbg & transfuse if < 7, or < 9 and bleeding/hemodynamic changes --Monitor hemodynamics    Abdominal pain due to cholelithiasis - POA - improved today No fever or leukocytosis, no evidence of cholecystitis.   RUQ U/S on admission shows cholelithiasis without acute cholecystitis --Pain control and antiemetics PRN --Diet as tolerated --Outpatient surgery follow up elective cholecystectomy.   Cholelithiasis: -see above   Diarrhea - due to melena -- 5/2 pt only one BM since admission.  Diarrhea at home was dark black and consistent with melena. Very low  suspicion for infection. --Cancel c diff & GI panel, enteric precautions   Pulmonary embolism  --Hold Eliquis  --Vascular surgery placed IVC filter today   Homeless --TOC is consulted   Tobacco abuse and Polysubstance abuse - Data counseling about importance of quitting substance use and smoking - Nicotine  patch   Bipolar 1 disorder with moderate mania (HCC) and PTSD -Zoloft , prazosin    Obesity (BMI 30-39.9): Body mass index is 33.79 kg/m. Complicates overall care and prognosis.  Recommend lifestyle modifications including physical activity and diet for weight loss and overall long-term health.       Subjective: Pt seen after IVC filter placed this afternoon.  He reports feeling better today.  Denies diarrhea.  Had several black liquid stools at home but only one BM here since admission.  No abdominal pain or nausea/vomiting.  Tolerating meals.  No other complaints.  Physical Exam: Vitals:   05/28/23 1500 05/28/23 1515 05/28/23 1530 05/28/23 1600  BP: 127/74 127/78 127/72 127/83  Pulse: 69 76  61  Resp: 12 16 15 16   Temp: 98.1 F (36.7 C)   98.5 F (36.9 C)  TempSrc: Oral     SpO2: 96% 98% 98% 100%  Weight:      Height:       General exam: awake, alert, no acute distress HEENT: atraumatic, clear conjunctiva, anicteric sclera, moist mucus membranes, hearing grossly normal  Respiratory system: CTAB, no wheezes, rales or rhonchi, normal respiratory effort. Cardiovascular system: normal S1/S2, RRR, no JVD, murmurs, rubs, gallops,  no pedal edema.   Gastrointestinal system: soft, NT, ND, no HSM felt, +bowel sounds. Central nervous system: A&O x 3. no gross focal neurologic deficits, normal speech  Extremities: moves all, no edema, normal tone Skin: dry, intact, normal temperature Psychiatry: normal mood, congruent affect, judgement and insight appear normal   Data Reviewed:  Notable labs --  Normal BMP except Ca 8.8 Normal CBC --  Hbg trended down to 12.8 >> 12.7 >>  and improved to 13.3  Family Communication: None. Pt updated in detail  Disposition: Status is: Inpatient Remains inpatient appropriate because: ongoing evaluation      Planned Discharge Destination: Home    Time spent: 45 minutes  Author: Montey Apa, DO 05/28/2023 6:33 PM  For on call review [email or web address].

## 2023-05-28 NOTE — TOC Initial Note (Signed)
 Transition of Care [facility]) - Initial/Assessment Note    Patient Details  Name: Todd Ware MRN: [id number] Date of Birth: 1985/04/10  Transition of Care [facility]) CM/SW Contact:    Crayton Docker, RN Phone Number: 05/28/2023, 12:41 PM  Clinical Narrative:                  CM to patient's room regarding TOC screening assessment. Patient complained of chest pain and requests "I need to see a doctor. I am having chest pain." CM alerted call system for RN. Consulting civil engineer in room. CM will continue to follow.   Patient Goals and CMS Choice    TBD   Expected Discharge Plan and Services    TBD   Prior Living Arrangements/Services    Activities of Daily Living   ADL Screening (condition at time of admission) Independently performs ADLs?: Yes (appropriate for developmental age) Is the patient deaf or have difficulty hearing?: No Does the patient have difficulty seeing, even when wearing glasses/contacts?: No Does the patient have difficulty concentrating, remembering, or making decisions?: No  Permission Sought/Granted   TBD   Emotional Assessment    Unable to assess    Admission diagnosis:  Rectal bleeding [K62.5] Acute upper GI bleed [K92.2] Abdominal pain [R10.9] Patient Active Problem List   Diagnosis Date Noted   Abdominal pain 05/27/2023   Obesity (BMI 30-39.9) 05/27/2023   Homeless 05/27/2023   Rectal bleeding 05/27/2023   Cholelithiasis 05/27/2023   Diarrhea 05/27/2023   Pulmonary embolism (HCC) 05/22/2023   Choledocholithiasis 05/22/2023   Tobacco abuse 05/22/2023   Polysubstance abuse (HCC) 05/22/2023   Bipolar depression (HCC) 08/11/2018   Bipolar 1 disorder with moderate mania (HCC) 08/10/2018   PCP:  Vicente Graham, No Pharmacy:   [facility] [address] [place] [address] Phone: [phone] Fax: [phone]     Social Drivers of Health (SDOH) Social History: SDOH Screenings   Food Insecurity: Food  Insecurity Present (05/28/2023)  Housing: High Risk (05/28/2023)  Transportation Needs: No Transportation Needs (05/28/2023)  Recent Concern: Transportation Needs - Unmet Transportation Needs (05/22/2023)  Utilities: Not At Risk (05/28/2023)  Alcohol Screen: Medium Risk (08/10/2018)  Tobacco Use: High Risk (05/22/2023)   SDOH Interventions:     Readmission Risk Interventions     No data to display

## 2023-05-28 NOTE — Progress Notes (Signed)
 Patient returns from IVC filter placement. Gauze dressing to right groin CDI, area soft. Patient expresses understanding to lay flat until 1630 and requests potato chips. States he is hungry. Regular diet, nurse will bring sandwich tray.

## 2023-05-29 DIAGNOSIS — K625 Hemorrhage of anus and rectum: Secondary | ICD-10-CM | POA: Diagnosis not present

## 2023-05-29 LAB — CBC
HCT: 38.7 % — ABNORMAL LOW (ref 39.0–52.0)
Hemoglobin: 12.7 g/dL — ABNORMAL LOW (ref 13.0–17.0)
MCH: 28.9 pg (ref 26.0–34.0)
MCHC: 32.8 g/dL (ref 30.0–36.0)
MCV: 88.2 fL (ref 80.0–100.0)
Platelets: 182 10*3/uL (ref 150–400)
RBC: 4.39 MIL/uL (ref 4.22–5.81)
RDW: 13.3 % (ref 11.5–15.5)
WBC: 3.7 10*3/uL — ABNORMAL LOW (ref 4.0–10.5)
nRBC: 0 % (ref 0.0–0.2)

## 2023-05-29 LAB — GLUCOSE, CAPILLARY: Glucose-Capillary: 125 mg/dL — ABNORMAL HIGH (ref 70–99)

## 2023-05-29 MED ORDER — OXYCODONE-ACETAMINOPHEN 5-325 MG PO TABS
2.0000 | ORAL_TABLET | Freq: Four times a day (QID) | ORAL | Status: DC | PRN
  Administered 2023-05-29 – 2023-05-31 (×8): 2 via ORAL
  Filled 2023-05-29 (×8): qty 2

## 2023-05-29 MED ORDER — OXYCODONE-ACETAMINOPHEN 5-325 MG PO TABS: 1.0000 | ORAL_TABLET | Freq: Four times a day (QID) | ORAL | Status: DC | PRN

## 2023-05-29 MED ORDER — MORPHINE SULFATE (PF) 2 MG/ML IV SOLN
2.0000 mg | INTRAVENOUS | Status: DC | PRN
  Administered 2023-05-29: 2 mg via INTRAVENOUS
  Filled 2023-05-29 (×2): qty 1

## 2023-05-29 MED ORDER — MORPHINE SULFATE (PF) 4 MG/ML IV SOLN
4.0000 mg | INTRAVENOUS | Status: DC | PRN
  Administered 2023-05-30 (×3): 4 mg via INTRAVENOUS
  Filled 2023-05-29 (×3): qty 1

## 2023-05-29 NOTE — Progress Notes (Signed)
 GI Inpatient Follow-up Note  Subjective:  Patient seen in follow-up for melena/GIB. No acute events overnight. He reports no further episodes of black stool in over 24 hours. Hemoglobin 12.7 this morning. He does still have discomfort in his groin from the procedure yesterday. No new complaints.   Scheduled Inpatient Medications:   nicotine   21 mg Transdermal Daily   pantoprazole  (PROTONIX ) IV  40 mg Intravenous Q12H   prazosin   1 mg Oral QHS   sertraline   25 mg Oral Daily    PRN Inpatient Medications:  acetaminophen , morphine  injection, ondansetron  (ZOFRAN ) IV, oxyCODONE -acetaminophen , oxyCODONE -acetaminophen   Review of Systems: Constitutional: Weight is stable.  Eyes: No changes in vision. ENT: No oral lesions, sore throat.  GI: see HPI.  Heme/Lymph: No easy bruising.  CV: No chest pain.  GU: No hematuria.  Integumentary: No rashes.  Neuro: No headaches.  Psych: No depression/anxiety.  Endocrine: No heat/cold intolerance.  Allergic/Immunologic: No urticaria.  Resp: No cough, SOB.  Musculoskeletal: No joint swelling.    Physical Examination: BP 122/75 (BP Location: Left Arm)   Pulse (!) 50   Temp 98 F (36.7 C)   Resp 16   Ht 6' (1.829 m)   Wt 113 kg   SpO2 100%   BMI 33.79 kg/m  Gen: NAD, alert and oriented x 4 HEENT: PEERLA, EOMI, Neck: supple, no JVD or thyromegaly Chest: CTA bilaterally, no wheezes, crackles, or other adventitious sounds CV: RRR, no m/g/c/r Abd: soft, NT, ND, +BS in all four quadrants; no HSM, guarding, ridigity, or rebound tenderness Ext: no edema, well perfused with 2+ pulses, Skin: no rash or lesions noted Lymph: no LAD  Data: Lab Results  Component Value Date   WBC 3.7 (L) 05/29/2023   HGB 12.7 (L) 05/29/2023   HCT 38.7 (L) 05/29/2023   MCV 88.2 05/29/2023   PLT 182 05/29/2023   Recent Labs  Lab 05/28/23 0725 05/28/23 1241 05/29/23 0337  HGB 13.3 13.3 12.7*   Lab Results  Component Value Date   NA 139 05/28/2023   K  3.8 05/28/2023   CL 106 05/28/2023   CO2 25 05/28/2023   BUN 12 05/28/2023   CREATININE 0.73 05/28/2023   Lab Results  Component Value Date   ALT 35 05/27/2023   AST 38 05/27/2023   ALKPHOS 67 05/27/2023   BILITOT 0.9 05/27/2023   Recent Labs  Lab 05/28/23 0113  APTT 35  INR 1.3*    Assessment/Plan:  38 y/o AA male with a PMH of homelessness, tobacco abuse, marijuana abuse, Bipolar I disorder, PTSD, hx of seizures, obesity (BMI 33), and recent hospital admission for acute PE and cholelithiasis/choledocholithiasis s/p ERCP with biliary sphincterotomy 05/24/23 presented to the [facility] ED 5/1 for chief complaint of acute upper abdominal pain and melena. GI consulted for further evaluation and management.    Melena concerning for GIB   Acute upper abdominal pain   Acute PE s/p IVC filter placement 05/28/2023   Cholelithiasis    Hx of polysubstance abuse   Obesity   Homelessness   Recommendations:   - Hemodynamically stable currently with no evidence of overt GIB - Continue to monitor serial H&H. Transfuse for Hgb <7.0.  - Continue to monitor for signs of GI bleeding - Continue PPI BID for gastric protection  - Hold Eliquis  at this time - EGD tomorrow AM with Dr. Corky Diener - OK to restart diet today. NPO after midnight.  - See procedure note for findings and further recommendations    I  reviewed the risks (including bleeding, perforation, infection, anesthesia complications, cardiac/respiratory complications), benefits and alternatives of EGD. Patient consents to proceed.   Please call with questions or concerns.    Jamee Mazzoni, PA-C [facility] [phone]

## 2023-05-29 NOTE — H&P (View-Only) (Signed)
 GI Inpatient Follow-up Note  Subjective:  Patient seen in follow-up for melena/GIB. No acute events overnight. He reports no further episodes of black stool in over 24 hours. Hemoglobin 12.7 this morning. He does still have discomfort in his groin from the procedure yesterday. No new complaints.   Scheduled Inpatient Medications:   nicotine   21 mg Transdermal Daily   pantoprazole  (PROTONIX ) IV  40 mg Intravenous Q12H   prazosin   1 mg Oral QHS   sertraline   25 mg Oral Daily    PRN Inpatient Medications:  acetaminophen , morphine  injection, ondansetron  (ZOFRAN ) IV, oxyCODONE -acetaminophen , oxyCODONE -acetaminophen   Review of Systems: Constitutional: Weight is stable.  Eyes: No changes in vision. ENT: No oral lesions, sore throat.  GI: see HPI.  Heme/Lymph: No easy bruising.  CV: No chest pain.  GU: No hematuria.  Integumentary: No rashes.  Neuro: No headaches.  Psych: No depression/anxiety.  Endocrine: No heat/cold intolerance.  Allergic/Immunologic: No urticaria.  Resp: No cough, SOB.  Musculoskeletal: No joint swelling.    Physical Examination: BP 122/75 (BP Location: Left Arm)   Pulse (!) 50   Temp 98 F (36.7 C)   Resp 16   Ht 6' (1.829 m)   Wt 113 kg   SpO2 100%   BMI 33.79 kg/m  Gen: NAD, alert and oriented x 4 HEENT: PEERLA, EOMI, Neck: supple, no JVD or thyromegaly Chest: CTA bilaterally, no wheezes, crackles, or other adventitious sounds CV: RRR, no m/g/c/r Abd: soft, NT, ND, +BS in all four quadrants; no HSM, guarding, ridigity, or rebound tenderness Ext: no edema, well perfused with 2+ pulses, Skin: no rash or lesions noted Lymph: no LAD  Data: Lab Results  Component Value Date   WBC 3.7 (L) 05/29/2023   HGB 12.7 (L) 05/29/2023   HCT 38.7 (L) 05/29/2023   MCV 88.2 05/29/2023   PLT 182 05/29/2023   Recent Labs  Lab 05/28/23 0725 05/28/23 1241 05/29/23 0337  HGB 13.3 13.3 12.7*   Lab Results  Component Value Date   NA 139 05/28/2023   K  3.8 05/28/2023   CL 106 05/28/2023   CO2 25 05/28/2023   BUN 12 05/28/2023   CREATININE 0.73 05/28/2023   Lab Results  Component Value Date   ALT 35 05/27/2023   AST 38 05/27/2023   ALKPHOS 67 05/27/2023   BILITOT 0.9 05/27/2023   Recent Labs  Lab 05/28/23 0113  APTT 35  INR 1.3*    Assessment/Plan:  38 y/o AA male with a PMH of homelessness, tobacco abuse, marijuana abuse, Bipolar I disorder, PTSD, hx of seizures, obesity (BMI 33), and recent hospital admission for acute PE and cholelithiasis/choledocholithiasis s/p ERCP with biliary sphincterotomy 05/24/23 presented to the [facility] ED 5/1 for chief complaint of acute upper abdominal pain and melena. GI consulted for further evaluation and management.    Melena concerning for GIB   Acute upper abdominal pain   Acute PE s/p IVC filter placement 05/28/2023   Cholelithiasis    Hx of polysubstance abuse   Obesity   Homelessness   Recommendations:   - Hemodynamically stable currently with no evidence of overt GIB - Continue to monitor serial H&H. Transfuse for Hgb <7.0.  - Continue to monitor for signs of GI bleeding - Continue PPI BID for gastric protection  - Hold Eliquis  at this time - EGD tomorrow AM with Dr. Corky Diener - OK to restart diet today. NPO after midnight.  - See procedure note for findings and further recommendations    I  reviewed the risks (including bleeding, perforation, infection, anesthesia complications, cardiac/respiratory complications), benefits and alternatives of EGD. Patient consents to proceed.   Please call with questions or concerns.    Jamee Mazzoni, PA-C [facility] [phone]

## 2023-05-29 NOTE — Progress Notes (Signed)
 IV team to bedside to assess for vascular access needs. Pt states that current IV is "irritating" and wants it "adjusted". This RN informs patient that the dressing can be changed or the IV can be removed. IV replacement to less mobile area offered. Pt declines all offers. Pt advised that IV team can return if needed. Primary RN informed.

## 2023-05-29 NOTE — Progress Notes (Signed)
 Progress Note   Patient: Todd Ware:[id number] DOB: 1985/11/27 DOA: 05/27/2023     1 DOS: the patient was seen and examined on 05/29/2023   Brief hospital course: HPI from admission 5/1 PM:  "Todd Ware is a 38 y.o. male with medical history significant of homeless, tobacco abuse, marijuana abuse, bipolar, PTSD, obesity, recent admission due to PE and cholelithiasis and choledocholithiasis, who presents with abdominal pain, diarrhea with dark stool. ..." See H&P for full HPI on admission & ED course.  Of note, pt recently started on Eliquis  for PE during admission 4/26--29. Also during that admission underwent ERCP for choledocholithiasis and was planned for outpatient elective cholecystectomy with surgery in follow-up.  Patient was admitted with GI consulted for evaluation of melena. Vascular surgery consulted for IVC filter placement in the setting of GI bleeding with Eliquis .    Further hospital course and management as outlined below.   Assessment and Plan:  Melena: presented with abdominal pain, diarrhea with liquid dark/black stools.  Hemoglobin stable 14.1 on admission.  CTA negative for active GI bleeding --GI consulted --Plan for EGD tomorrow AM 5/4 --Regular diet, NPO after midnight --Stop Eliquis  & avoid anticoagulants --IV PPI BID --No NSAID's --Off IV fluids, taking PO's --Zofran  IV PRN --Trend Hbg & transfuse if < 7, or < 9 and bleeding/hemodynamic changes --Monitor hemodynamics    Abdominal pain due to cholelithiasis - POA - improved today No fever or leukocytosis, no evidence of cholecystitis.   RUQ U/S on admission shows cholelithiasis without acute cholecystitis --Pain control and antiemetics PRN --Diet as tolerated --Outpatient surgery follow up elective cholecystectomy.   Cholelithiasis: -see above   Diarrhea - due to melena -- 5/2 pt only one BM since admission.  Diarrhea at home was dark black and consistent with melena. Very low  suspicion for infection. --Cancel c diff & GI panel, enteric precautions   Pulmonary embolism  --Hold Eliquis  --Vascular surgery placed IVC filter today   Homeless --TOC is consulted   Tobacco abuse and Polysubstance abuse - Data counseling about importance of quitting substance use and smoking - Nicotine  patch   Bipolar 1 disorder with moderate mania (HCC) and PTSD -Zoloft , prazosin    Obesity (BMI 30-39.9): Body mass index is 33.79 kg/m. Complicates overall care and prognosis.  Recommend lifestyle modifications including physical activity and diet for weight loss and overall long-term health.       Subjective: Pt seen after IVC filter placed this afternoon.  He reports feeling better today.  Denies diarrhea.  Had several black liquid stools at home but only one BM here since admission.  No abdominal pain or nausea/vomiting.  Tolerating meals.  No other complaints.  Physical Exam: Vitals:   05/28/23 1600 05/28/23 1952 05/29/23 0418 05/29/23 0853  BP: 127/83 123/71 111/67 122/75  Pulse: 61 68 (!) 56 (!) 50  Resp: 16 20  16   Temp: 98.5 F (36.9 C) 98 F (36.7 C) 98.3 F (36.8 C) 98 F (36.7 C)  TempSrc:   Oral   SpO2: 100% 99% 97% 100%  Weight:      Height:       General exam: sleeping comfortably, wakes to voice, no acute distress Respiratory system: on room air, normal respiratory effort. Cardiovascular system: RRR, no pedal edema.   Gastrointestinal system: soft, NT, ND Central nervous system: no gross focal neurologic deficits, normal speech Extremities: edema, normal tone Skin: dry, intact, normal temperature Psychiatry: exam limited by somnolence   Data Reviewed:  Notable labs --  WBC 3.7 Hbg 12.7    Family Communication: None. Pt updated in detail  Disposition: Status is: Inpatient Remains inpatient appropriate because: ongoing GI evaluation      Planned Discharge Destination: Home    Time spent: 38 minutes  Author: Montey Apa,  DO 05/29/2023 12:10 PM  For on call review [email or web address].

## 2023-05-29 NOTE — Plan of Care (Signed)

## 2023-05-30 ENCOUNTER — Inpatient Hospital Stay

## 2023-05-30 ENCOUNTER — Inpatient Hospital Stay: Admitting: Anesthesiology

## 2023-05-30 ENCOUNTER — Encounter
Admission: EM | Disposition: A | Discharge status: Home / Self Care | Payer: Self-pay | Source: Home / Self Care | Attending: Internal Medicine

## 2023-05-30 DIAGNOSIS — K625 Hemorrhage of anus and rectum: Secondary | ICD-10-CM | POA: Diagnosis not present

## 2023-05-30 HISTORY — PX: ESOPHAGOGASTRODUODENOSCOPY: SHX5428

## 2023-05-30 SURGERY — EGD (ESOPHAGOGASTRODUODENOSCOPY)
Anesthesia: General

## 2023-05-30 MED ORDER — POLYETHYLENE GLYCOL 3350 17 G PO PACK
17.0000 g | PACK | Freq: Every day | ORAL | Status: DC
  Administered 2023-05-30 – 2023-05-31 (×2): 17 g via ORAL
  Filled 2023-05-30 (×2): qty 1

## 2023-05-30 MED ORDER — SENNOSIDES-DOCUSATE SODIUM 8.6-50 MG PO TABS
1.0000 | ORAL_TABLET | Freq: Two times a day (BID) | ORAL | Status: DC
  Administered 2023-05-30 – 2023-05-31 (×3): 1 via ORAL
  Filled 2023-05-30 (×3): qty 1

## 2023-05-30 MED ORDER — PROPOFOL 10 MG/ML IV BOLUS
INTRAVENOUS | Status: AC
  Filled 2023-05-30: qty 40

## 2023-05-30 MED ORDER — DEXAMETHASONE SODIUM PHOSPHATE 10 MG/ML IJ SOLN
INTRAMUSCULAR | Status: DC | PRN
  Administered 2023-05-30: 4 mg via INTRAVENOUS

## 2023-05-30 MED ORDER — LIDOCAINE HCL (CARDIAC) PF 100 MG/5ML IV SOSY
PREFILLED_SYRINGE | INTRAVENOUS | Status: DC | PRN
Start: 2023-05-30 — End: 2023-05-30
  Administered 2023-05-30: 100 mg via INTRAVENOUS

## 2023-05-30 MED ORDER — PROPOFOL 10 MG/ML IV BOLUS
INTRAVENOUS | Status: DC | PRN
  Administered 2023-05-30: 140 mg via INTRAVENOUS

## 2023-05-30 MED ORDER — SODIUM CHLORIDE 0.9 % IV SOLN: INTRAVENOUS | Status: DC

## 2023-05-30 MED ORDER — ONDANSETRON HCL 4 MG/2ML IJ SOLN
INTRAMUSCULAR | Status: DC | PRN
  Administered 2023-05-30: 4 mg via INTRAVENOUS

## 2023-05-30 NOTE — Interval H&P Note (Signed)
 History and Physical Interval Note:  05/30/2023 8:17 AM  Todd Ware  has presented today for surgery, with the diagnosis of Melena, GIB.  The various methods of treatment have been discussed with the patient and family. After consideration of risks, benefits and other options for treatment, the patient has consented to  Procedure(s): EGD (ESOPHAGOGASTRODUODENOSCOPY) (N/A) as a surgical intervention.  The patient's history has been reviewed, patient examined, no change in status, stable for surgery.  I have reviewed the patient's chart and labs.  Questions were answered to the patient's satisfaction.     Bernie, Tyon Cerasoli

## 2023-05-30 NOTE — Progress Notes (Signed)
 Progress Note   Patient: Todd Ware WUJ:[id number] DOB: 05-Sep-1985 DOA: 05/27/2023     2 DOS: the patient was seen and examined on 05/30/2023   Brief hospital course: HPI from admission 5/1 PM:  "Todd Ware is a 38 y.o. male with medical history significant of homeless, tobacco abuse, marijuana abuse, bipolar, PTSD, obesity, recent admission due to PE and cholelithiasis and choledocholithiasis, who presents with abdominal pain, diarrhea with dark stool. ..." See H&P for full HPI on admission & ED course.  Of note, pt recently started on Eliquis  for PE during admission 4/26--29. Also during that admission underwent ERCP for choledocholithiasis and was planned for outpatient elective cholecystectomy with surgery in follow-up.  Patient was admitted with GI consulted for evaluation of melena. Vascular surgery consulted for IVC filter placement in the setting of GI bleeding with Eliquis .    Further hospital course and management as outlined below.   Assessment and Plan:  Melena: resolved with stopping Eliquis  Presented with abdominal pain, diarrhea with liquid dark/black stools.   Hemoglobin stable 14.1 on admission, hemodynamically stable.  CTA negative for active GI bleeding EGD ruled out upper GI bleeding source.  --GI consulted --EGD this AM was normal --GI signed off, no recommendation for colonoscopy or any further evaluation --Regular diet resumed --Stop Eliquis  & avoid anticoagulants --Stop IV PPI BID --Avoid all NSAID's --Zofran  IV PRN --Trend Hbg & transfuse if < 7, or < 9 and bleeding/hemodynamic changes  Abdominal pain due to cholelithiasis - POA - improved today No fever or leukocytosis, no evidence of cholecystitis.   RUQ U/S on admission shows cholelithiasis without acute cholecystitis --Pain control and antiemetics PRN --Diet as tolerated --Outpatient surgery follow up elective cholecystectomy.   Cholelithiasis: -see above  Right testicle pain -- new  onset 5/3 afternoon per pt. --Scrotal U/S pending - follow results   Diarrhea - due to melena Resolved 5/2 pt only one BM since admission.  Diarrhea at home was dark black and consistent with melena.  Very low suspicion for infection. --Cancel c diff & GI panel, enteric precautions  Pulmonary embolism  --Hold Eliquis  --Vascular surgery placed IVC filter 05/28/23   Homeless --TOC is consulted   Tobacco abuse and Polysubstance abuse - Data counseling about importance of quitting substance use and smoking - Nicotine  patch   Bipolar 1 disorder with moderate mania (HCC) and PTSD -Zoloft , prazosin    Obesity (BMI 30-39.9): Body mass index is 33.79 kg/m. Complicates overall care and prognosis.  Recommend lifestyle modifications including physical activity and diet for weight loss and overall long-term health.       Subjective: Pt seen awake resting in bed this AM after EGD.  He is eager to eat a meal.  He reports new onset Right testicle pain that radiates to the hip and up into his abdomen.  Reports this started yesterday afternoon.     Physical Exam: Vitals:   05/30/23 0747 05/30/23 0828 05/30/23 0838 05/30/23 0906  BP: 128/81 116/75 119/84 120/64  Pulse: (!) 57 69 63 (!) 57  Resp: 14 15 17 16   Temp:    97.7 F (36.5 C)  TempSrc:    Oral  SpO2: 100% 100% 100% 100%  Weight:      Height:       General exam: sleeping comfortably, wakes to voice, no acute distress Respiratory system: on room air, normal respiratory effort. Cardiovascular system: RRR, no pedal edema.   Gastrointestinal system: soft, NT, ND Genitourinary system: R testicle slightly larger than  left and mildly tender with palpation and elevation, no surrounding swelling Central nervous system: A&Ox3, no gross focal neurologic deficits, normal speech Extremities: right groin occlusive dressing in place no surrounding erythema warmth or swelling, no edema, normal tone Psychiatry: normal mood and affect, intact  judgement and insight   Data Reviewed:  No new labs today    Family Communication: None. Pt updated in detail  Disposition: Status is: Inpatient Remains inpatient appropriate because: ongoing evaluation of new onset scrotal pain      Planned Discharge Destination: Home    Time spent: 42 minutes  Author: Montey Apa, DO 05/30/2023 2:15 PM  For on call review [email or web address].

## 2023-05-30 NOTE — Anesthesia Postprocedure Evaluation (Signed)
 Anesthesia Post Note  Patient: Todd Ware  Procedure(s) Performed: EGD (ESOPHAGOGASTRODUODENOSCOPY)  Patient location during evaluation: Endoscopy Anesthesia Type: General Level of consciousness: awake and alert Pain management: pain level controlled Vital Signs Assessment: post-procedure vital signs reviewed and stable Respiratory status: spontaneous breathing, nonlabored ventilation, respiratory function stable and patient connected to nasal cannula oxygen Cardiovascular status: blood pressure returned to baseline and stable Postop Assessment: no apparent nausea or vomiting Anesthetic complications: no   No notable events documented.   Last Vitals:  Vitals:   05/30/23 0838 05/30/23 0906  BP: 119/84 120/64  Pulse: 63 (!) 57  Resp: 17 16  Temp:  36.5 C  SpO2: 100% 100%    Last Pain:  Vitals:   05/30/23 0923  TempSrc:   PainSc: 10-Worst pain ever                 Portia Brittle Seymore Brodowski

## 2023-05-30 NOTE — Anesthesia Preprocedure Evaluation (Signed)
 Anesthesia Evaluation  Patient identified by MRN, date of birth, ID band Patient awake    Reviewed: Allergy & Precautions, NPO status , Patient's Chart, lab work & pertinent test results  History of Anesthesia Complications Negative for: history of anesthetic complications  Airway Mallampati: III  TM Distance: >3 FB Neck ROM: full    Dental  (+) Chipped, Poor Dentition   Pulmonary neg pulmonary ROS, neg shortness of breath, Current Smoker   Pulmonary exam normal        Cardiovascular (-) angina (-) Past MI negative cardio ROS Normal cardiovascular exam     Neuro/Psych Seizures -,  PSYCHIATRIC DISORDERS         GI/Hepatic negative GI ROS, Neg liver ROS,neg GERD  ,,  Endo/Other  negative endocrine ROS    Renal/GU negative Renal ROS  negative genitourinary   Musculoskeletal   Abdominal   Peds  Hematology negative hematology ROS (+)   Anesthesia Other Findings Patient is NPO appropriate and reports no nausea or vomiting today.   Past Medical History: No date: Seizures [facility])  Past Surgical History: 05/24/2023: ERCP; N/A     Comment:  Procedure: ERCP, WITH INTERVENTION IF INDICATED;                Surgeon: Marnee Sink, MD;  Location: [facility] ENDOSCOPY;                Service: Endoscopy;  Laterality: N/A; No date: FACIAL FRACTURE SURGERY 05/24/2023: SPYGLASS LITHOTRIPSY     Comment:  Procedure: ERCP, WITH CHOLANGIOSCOPY AND LITHOTRIPSY;                Surgeon: Marnee Sink, MD;  Location: [facility] ENDOSCOPY;                Service: Endoscopy;;  BMI    Body Mass Index: 33.79 kg/m      Reproductive/Obstetrics negative OB ROS                             Anesthesia Physical Anesthesia Plan  ASA: 3  Anesthesia Plan: General   Post-op Pain Management:    Induction: Intravenous  PONV Risk Score and Plan: Propofol  infusion and TIVA  Airway Management Planned: Natural Airway and Nasal  Cannula  Additional Equipment:   Intra-op Plan:   Post-operative Plan:   Informed Consent: I have reviewed the patients History and Physical, chart, labs and discussed the procedure including the risks, benefits and alternatives for the proposed anesthesia with the patient or authorized representative who has indicated his/her understanding and acceptance.     Dental Advisory Given  Plan Discussed with: Anesthesiologist, CRNA and Surgeon  Anesthesia Plan Comments: (Patient consented for risks of anesthesia including but not limited to:  - adverse reactions to medications - risk of airway placement if required - damage to eyes, teeth, lips or other oral mucosa - nerve damage due to positioning  - sore throat or hoarseness - Damage to heart, brain, nerves, lungs, other parts of body or loss of life  Patient voiced understanding and assent.)       Anesthesia Quick Evaluation

## 2023-05-30 NOTE — Plan of Care (Signed)

## 2023-05-30 NOTE — Transfer of Care (Signed)
 Immediate Anesthesia Transfer of Care Note  Patient: Todd Ware  Procedure(s) Performed: EGD (ESOPHAGOGASTRODUODENOSCOPY)  Patient Location: Endoscopy Unit  Anesthesia Type:General  Level of Consciousness: awake, alert , and oriented  Airway & Oxygen Therapy: Patient Spontanous Breathing  Post-op Assessment: Report given to RN, Post -op Vital signs reviewed and stable, and Patient moving all extremities  Post vital signs: Reviewed and stable  Last Vitals:  Vitals Value Taken Time  BP 119/84 05/30/23 0838  Temp    Pulse 54 05/30/23 0845  Resp 12 05/30/23 0845  SpO2 100 % 05/30/23 0845  Vitals shown include unfiled device data.  Last Pain:  Vitals:   05/30/23 0838  TempSrc:   PainSc: 3       Patients Stated Pain Goal: 2 (05/29/23 1610)  Complications: No notable events documented.

## 2023-05-30 NOTE — Op Note (Signed)
 [facility] Gastroenterology Patient Name: Todd Ware Procedure Date: 05/30/2023 7:47 AM MRN: [id number] Account #: [id number] Date of Birth: 1985/11/26 Admit Type: Inpatient Age: 38 Room: [facility] ENDO ROOM 4 Gender: Male Note Status: Finalized Instrument Name: Upper Endoscope [id number] Procedure:             Upper GI endoscopy Indications:           Melena Providers:             Cielo Arias K. Corky Diener MD, MD Referring MD:          No Local Md, MD (Referring MD) Medicines:             Propofol  per Anesthesia Complications:         No immediate complications. Procedure:             Pre-Anesthesia Assessment:                        - The risks and benefits of the procedure and the                         sedation options and risks were discussed with the                         patient. All questions were answered and informed                         consent was obtained.                        - Patient identification and proposed procedure were                         verified prior to the procedure by the nurse. The                         procedure was verified in the procedure room.                        - ASA Grade Assessment: II - A patient with mild                         systemic disease.                        - After reviewing the risks and benefits, the patient                         was deemed in satisfactory condition to undergo the                         procedure.                        After obtaining informed consent, the endoscope was                         passed under direct vision. Throughout the procedure,                         the  patient's blood pressure, pulse, and oxygen                         saturations were monitored continuously. The Endoscope                         was introduced through the mouth, and advanced to the                         third part of duodenum. The upper GI endoscopy was                         accomplished  without difficulty. The patient tolerated                         the procedure well. Findings:      The esophagus was normal.      The stomach was normal.      The examined duodenum was normal. Impression:            - Normal esophagus.                        - Normal stomach.                        - Normal examined duodenum.                        - No specimens collected. Recommendation:        - Resume previous diet.                        - Return patient to hospital ward for ongoing care.                        - [employer] will sign off for now. Call us  back if any                         changes clinically or if we can help for any reason. Procedure Code(s):     --- Professional ---                        [phone], Esophagogastroduodenoscopy, flexible,                         transoral; diagnostic, including collection of                         specimen(s) by brushing or washing, when performed                         (separate procedure) Diagnosis Code(s):     --- Professional ---                        K92.1, Melena (includes Hematochezia) CPT copyright 2022 [employer]. All rights reserved. The codes documented in this report are preliminary and upon coder review may  be revised to meet current compliance requirements. Cassie Click MD, MD 05/30/2023 8:26:21 AM This report has been signed electronically. Number of  Addenda: 0 Note Initiated On: 05/30/2023 7:47 AM Estimated Blood Loss:  Estimated blood loss: none.      [facility]

## 2023-05-31 ENCOUNTER — Inpatient Hospital Stay

## 2023-05-31 ENCOUNTER — Other Ambulatory Visit: Payer: Self-pay

## 2023-05-31 ENCOUNTER — Encounter: Payer: Self-pay | Admitting: Oncology

## 2023-05-31 ENCOUNTER — Inpatient Hospital Stay: Attending: Oncology | Admitting: Oncology

## 2023-05-31 ENCOUNTER — Encounter: Payer: Self-pay | Admitting: Vascular Surgery

## 2023-05-31 VITALS — BP 128/80 | HR 84 | Temp 98.8°F | Resp 18 | Ht 72.0 in | Wt 243.0 lb

## 2023-05-31 DIAGNOSIS — R109 Unspecified abdominal pain: Secondary | ICD-10-CM | POA: Diagnosis not present

## 2023-05-31 DIAGNOSIS — I2699 Other pulmonary embolism without acute cor pulmonale: Secondary | ICD-10-CM

## 2023-05-31 DIAGNOSIS — K921 Melena: Secondary | ICD-10-CM | POA: Diagnosis not present

## 2023-05-31 DIAGNOSIS — E669 Obesity, unspecified: Secondary | ICD-10-CM | POA: Diagnosis not present

## 2023-05-31 DIAGNOSIS — R911 Solitary pulmonary nodule: Secondary | ICD-10-CM | POA: Insufficient documentation

## 2023-05-31 DIAGNOSIS — M79605 Pain in left leg: Secondary | ICD-10-CM | POA: Insufficient documentation

## 2023-05-31 DIAGNOSIS — F319 Bipolar disorder, unspecified: Secondary | ICD-10-CM | POA: Insufficient documentation

## 2023-05-31 DIAGNOSIS — Z809 Family history of malignant neoplasm, unspecified: Secondary | ICD-10-CM

## 2023-05-31 DIAGNOSIS — K625 Hemorrhage of anus and rectum: Secondary | ICD-10-CM | POA: Diagnosis not present

## 2023-05-31 DIAGNOSIS — Z86711 Personal history of pulmonary embolism: Secondary | ICD-10-CM | POA: Insufficient documentation

## 2023-05-31 DIAGNOSIS — F17290 Nicotine dependence, other tobacco product, uncomplicated: Secondary | ICD-10-CM | POA: Insufficient documentation

## 2023-05-31 DIAGNOSIS — K8070 Calculus of gallbladder and bile duct without cholecystitis without obstruction: Secondary | ICD-10-CM | POA: Insufficient documentation

## 2023-05-31 DIAGNOSIS — K5730 Diverticulosis of large intestine without perforation or abscess without bleeding: Secondary | ICD-10-CM | POA: Insufficient documentation

## 2023-05-31 DIAGNOSIS — J432 Centrilobular emphysema: Secondary | ICD-10-CM | POA: Insufficient documentation

## 2023-05-31 DIAGNOSIS — Z452 Encounter for adjustment and management of vascular access device: Secondary | ICD-10-CM | POA: Insufficient documentation

## 2023-05-31 DIAGNOSIS — N50811 Right testicular pain: Secondary | ICD-10-CM | POA: Insufficient documentation

## 2023-05-31 DIAGNOSIS — I2693 Single subsegmental pulmonary embolism without acute cor pulmonale: Secondary | ICD-10-CM | POA: Diagnosis not present

## 2023-05-31 DIAGNOSIS — Z7901 Long term (current) use of anticoagulants: Secondary | ICD-10-CM | POA: Insufficient documentation

## 2023-05-31 DIAGNOSIS — F1290 Cannabis use, unspecified, uncomplicated: Secondary | ICD-10-CM | POA: Insufficient documentation

## 2023-05-31 DIAGNOSIS — F17210 Nicotine dependence, cigarettes, uncomplicated: Secondary | ICD-10-CM | POA: Insufficient documentation

## 2023-05-31 DIAGNOSIS — Z79899 Other long term (current) drug therapy: Secondary | ICD-10-CM | POA: Insufficient documentation

## 2023-05-31 DIAGNOSIS — Z5982 Transportation insecurity: Secondary | ICD-10-CM | POA: Insufficient documentation

## 2023-05-31 DIAGNOSIS — I871 Compression of vein: Secondary | ICD-10-CM | POA: Insufficient documentation

## 2023-05-31 DIAGNOSIS — Z800 Family history of malignant neoplasm of digestive organs: Secondary | ICD-10-CM | POA: Insufficient documentation

## 2023-05-31 LAB — CBC (CANCER CENTER ONLY)
HCT: 43.9 % (ref 39.0–52.0)
Hemoglobin: 14.7 g/dL (ref 13.0–17.0)
MCH: 29 pg (ref 26.0–34.0)
MCHC: 33.5 g/dL (ref 30.0–36.0)
MCV: 86.6 fL (ref 80.0–100.0)
Platelet Count: 200 10*3/uL (ref 150–400)
RBC: 5.07 MIL/uL (ref 4.22–5.81)
RDW: 13.5 % (ref 11.5–15.5)
WBC Count: 8.1 10*3/uL (ref 4.0–10.5)
nRBC: 0 % (ref 0.0–0.2)

## 2023-05-31 LAB — ANTITHROMBIN III: AntiThromb III Func: 110 % (ref 75–120)

## 2023-05-31 MED ORDER — SENNOSIDES-DOCUSATE SODIUM 8.6-50 MG PO TABS
1.0000 | ORAL_TABLET | Freq: Two times a day (BID) | ORAL | 0 refills | Status: AC | PRN
  Filled 2023-05-31: qty 60, 30d supply, fill #0

## 2023-05-31 MED ORDER — POLYETHYLENE GLYCOL 3350 17 GM/SCOOP PO POWD
17.0000 g | Freq: Two times a day (BID) | ORAL | 1 refills | Status: AC | PRN
  Filled 2023-05-31: qty 238, 7d supply, fill #0

## 2023-05-31 MED ORDER — ONDANSETRON HCL 4 MG PO TABS
8.0000 mg | ORAL_TABLET | Freq: Once | ORAL | Status: AC
  Administered 2023-05-31: 8 mg via ORAL
  Filled 2023-05-31: qty 2

## 2023-05-31 MED ORDER — MORPHINE SULFATE (PF) 2 MG/ML IV SOLN
2.0000 mg | Freq: Once | INTRAVENOUS | Status: AC
  Administered 2023-05-31: 2 mg via INTRAVENOUS
  Filled 2023-05-31: qty 1

## 2023-05-31 MED ORDER — OXYCODONE HCL 5 MG PO TABS
5.0000 mg | ORAL_TABLET | Freq: Four times a day (QID) | ORAL | 0 refills | Status: AC | PRN
Start: 2023-05-31 — End: ?
  Filled 2023-05-31: qty 20, 3d supply, fill #0

## 2023-05-31 MED ORDER — MAGNESIUM HYDROXIDE 400 MG/5ML PO SUSP
30.0000 mL | Freq: Once | ORAL | Status: AC
  Administered 2023-05-31: 30 mL via ORAL
  Filled 2023-05-31: qty 30

## 2023-05-31 MED ORDER — OXYCODONE HCL 5 MG PO TABS: 5.0000 mg | ORAL_TABLET | Freq: Four times a day (QID) | ORAL | 0 refills | Status: DC | PRN

## 2023-05-31 NOTE — Discharge Summary (Addendum)
 Physician Discharge Summary   Patient: Todd Ware MRN: [id number] DOB: 1985-11-15  Admit date:     05/27/2023  Discharge date: 05/31/23  Discharge Physician: Montey Apa   PCP: Pcp, No   Recommendations at discharge:   Follow up with Hematology, Dr. Randy Buttery, today at 1:30 PM at the [facility] for evaluation into etiology of PE.    Follow up with Vascular Surgery in 6-8 weeks for IVC filter follow up Follow up with [facility] if 2-4 weeks Repeat CBC, CMP in 1-2 weeks Follow up with Primary Care in 1-2 weeks   Discharge Diagnoses: Active Problems:   Abdominal pain   Diarrhea   Pulmonary embolism (HCC)   Cholelithiasis   Homeless   Tobacco abuse   Bipolar 1 disorder with moderate mania (HCC)   Polysubstance abuse (HCC)   Obesity (BMI 30-39.9)  Principal Problem (Resolved):   Rectal bleeding Resolved Problems:   Melena  Hospital Course:  HPI from admission 5/1 PM:  "Todd Ware is a 38 y.o. male with medical history significant of homeless, tobacco abuse, marijuana abuse, bipolar, PTSD, obesity, recent admission due to PE and cholelithiasis and choledocholithiasis, who presents with abdominal pain, diarrhea with dark stool. ..." See H&P for full HPI on admission & ED course.   Of note, pt recently started on Eliquis  for PE during admission 4/26--29. Also during that admission underwent ERCP for choledocholithiasis and was planned for outpatient elective cholecystectomy with surgery in follow-up.   Patient was admitted with GI consulted for evaluation of melena. Vascular surgery consulted for IVC filter placement in the setting of GI bleeding with Eliquis .     Further hospital course and management as outlined below.  5/5 -- patient is medically stable for discharge and will see Hematology today regarding Eliquis .   Assessment and Plan:  Melena: resolved with stopping Eliquis  Presented with abdominal pain, diarrhea with liquid dark/black stools.    Hemoglobin stable 14.1 on admission, hemodynamically stable.  Hemoglobin trended down slightly to 12.7.  No further bleeding. CTA negative for active GI bleeding EGD ruled out upper GI bleeding source.   --GI consulted -- will see in follow up outpatient 2-4 weeks --EGD was normal 5/4.   --No further inpatient GI evaluation recommended. --Eliquis  is on hold --Patient has Hematology follow up today with Dr. Randy Buttery at 1:30.   Case discussed this AM by Secure Chat with Dr. Randy Buttery, she recommends resuming Eliquis  and closely monitor CBC. Will defer to her at follow up visit today.    Abdominal pain due to cholelithiasis - POA - improved today No fever or leukocytosis, no evidence of cholecystitis.   RUQ U/S on admission shows cholelithiasis without acute cholecystitis --Pain control and antiemetics PRN --Diet as tolerated --Outpatient surgery follow up elective cholecystectomy.   Cholelithiasis: -see above   Right testicle pain -- new onset 5/3 afternoon per pt. Urinating normally.  Ambulating well. --Scrotal U/S negative/normal --Soft tissue U/S right groin today also negative/normal   Diarrhea - due to melena Resolved 5/2 pt only one BM since admission.  Diarrhea at home was dark black and consistent with melena.  Very low suspicion for infection. --Cancel c diff & GI panel, enteric precautions   Pulmonary embolism  --Hold Eliquis  - resume per Hematology at follow up today --Vascular surgery placed IVC filter 05/28/23 --Vascular surgery follow up in 6-8 weeks   Homeless --TOC is consulted.   Pt given resources - see note of Verdia Glad, RN today   Tobacco  abuse and Polysubstance abuse - Data counseling about importance of quitting substance use and smoking - Nicotine  patch   Bipolar 1 disorder with moderate mania (HCC) and PTSD -Zoloft , prazosin    Obesity (BMI 30-39.9): Body mass index is 33.79 kg/m. Complicates overall care and prognosis.  Recommend lifestyle modifications  including physical activity and diet for weight loss and overall long-term health.         Consultants: GI, vascular surgery Procedures performed: IVC filter placement  Disposition: Home Diet recommendation:  Regular diet DISCHARGE MEDICATION: Allergies as of 05/31/2023   No Known Allergies      Medication List     PAUSE taking these medications    Eliquis  DVT/PE Starter Pack Wait to take this until your doctor or other care provider tells you to start again. Generic drug: Apixaban  Starter Pack (10mg  and 5mg ) Take as directed on package: start with two-5mg  tablets twice daily for 7 days. On day 8, switch to one-5mg  tablet twice daily.       TAKE these medications    Nicotine  Step 1 21 MG/24HR patch Generic drug: nicotine  Place 1 patch (21 mg total) onto the skin daily.   oxyCODONE  5 MG immediate release tablet Commonly known as: Oxy IR/ROXICODONE  Take 1-2 tablets (5-10 mg total) by mouth every 6 (six) hours as needed for moderate pain (pain score 4-6) or severe pain (pain score 7-10). What changed:  how much to take when to take this reasons to take this   polyethylene glycol powder 17 GM/SCOOP powder Commonly known as: MiraLax Take 17 g by mouth 2 (two) times daily as needed.   prazosin  1 MG capsule Commonly known as: MINIPRESS  Take 1 capsule (1 mg total) by mouth at bedtime.   sertraline  25 MG tablet Commonly known as: ZOLOFT  Take 1 tablet (25 mg total) by mouth daily.   Stool Softener/Laxative 50-8.6 MG tablet Generic drug: senna-docusate Take 1 tablet by mouth 2 (two) times daily as needed for mild constipation or moderate constipation.        Follow-up Information     Saucier, Murry Art, NP. Go on 06/03/2023.   Specialty: Psychiatry Why: Please go to your scheduled appointment on May 8th at 1pm with Cristal Don, NP. Please arrive at 12:45p. Contact information: [address] [place] [address] [phone]         Avonne Boettcher, MD. Go on 05/31/2023.   Specialty: Oncology Why: Please arrive at 1:15pm for your 1:30pm appointment Contact information: [address] [place] [address] [phone]         Carlean Charter, DO. Go on 07/23/2023.   Specialty: Family Medicine Why: Please arrive at 1030 for your NEW PATIENT APPOINTMENT Contact information: [address] [place] [address] [place] [address] [phone]         Pa, [facility] Od. Go on 06/29/2023.   Why: Please arrive at 10:15am for your 10:30am appointment Contact information: [address] [place] [address] [phone]                Discharge Exam: Filed Weights   05/27/23 1322 05/28/23 1326  Weight: 113 kg 113 kg   General exam: awake, alert, no acute distress HEENT: atraumatic, clear conjunctiva, anicteric sclera, moist mucus membranes, hearing grossly normal  Respiratory system: CTAB, no wheezes, rales or rhonchi, normal respiratory effort. Cardiovascular system: normal S1/S2, RRR, no JVD, murmurs, rubs, gallops, no pedal edema.   Gastrointestinal system: soft, NT, ND,  no HSM felt, +bowel sounds. Central nervous system: A&O x3. no gross focal neurologic deficits, normal speech Extremities: moves all , no edema, normal tone Skin: dry, intact, normal temperature Psychiatry: normal mood, congruent affect, judgement and insight appear normal   Condition at discharge: stable  The results of significant diagnostics from this hospitalization (including imaging, microbiology, ancillary and laboratory) are listed below for reference.   Imaging Studies: US  RT LOWER EXTREM LTD SOFT TISSUE NON VASCULAR Result Date: 05/31/2023 CLINICAL DATA:  Groin pain, hematoma versus seroma EXAM: ULTRASOUND right LOWER EXTREMITY LIMITED TECHNIQUE: Ultrasound examination of the lower extremity soft tissues was performed in the area of clinical concern. COMPARISON:  None Available. FINDINGS: Joint Space: No  effusion. Muscles: Normal. Tendons: Normal Other Soft Tissue Structures: No hematomas, no seromas Right groin normal-appearing lymph nodes measuring 1.6 x 0.5 x 0.9 cm IMPRESSION: No hematomas, no seromas. Electronically Signed   By: Fredrich Jefferson M.D.   On: 05/31/2023 11:07   US  SCROTUM W/DOPPLER Result Date: 05/30/2023 CLINICAL DATA:  Right testicular pain following IVC filter placement EXAM: SCROTAL ULTRASOUND DOPPLER ULTRASOUND OF THE TESTICLES TECHNIQUE: Complete ultrasound examination of the testicles, epididymis, and other scrotal structures was performed. Color and spectral Doppler ultrasound were also utilized to evaluate blood flow to the testicles. COMPARISON:  None Available. FINDINGS: Right testicle Measurements: 4.5 x 2.3 x 3.4 cm. No mass or microlithiasis visualized. Left testicle Measurements: 4.2 x 2.5 x 3.2 cm. No mass or microlithiasis visualized. Right epididymis:  Normal in size and appearance. Left epididymis:  Normal in size and appearance. Hydrocele:  None visualized. Varicocele:  None visualized. Pulsed Doppler interrogation of both testes demonstrates normal low resistance arterial and venous waveforms bilaterally. IMPRESSION: No acute abnormality noted. Electronically Signed   By: Violeta Grey M.D.   On: 05/30/2023 19:15   PERIPHERAL VASCULAR CATHETERIZATION Result Date: 05/28/2023 See surgical note for result.  CT ANGIO GI BLEED Result Date: 05/27/2023 CLINICAL DATA:  Left upper quadrant abdominal pain, nausea, history of cholelithiasis and choledocholithiasis EXAM: CTA ABDOMEN AND PELVIS WITHOUT AND WITH CONTRAST TECHNIQUE: Multidetector CT imaging of the abdomen and pelvis was performed using the standard protocol during bolus administration of intravenous contrast. Multiplanar reconstructed images and MIPs were obtained and reviewed to evaluate the vascular anatomy. RADIATION DOSE REDUCTION: This exam was performed according to the departmental dose-optimization program which  includes automated exposure control, adjustment of the mA and/or kV according to patient size and/or use of iterative reconstruction technique. CONTRAST:  OMNIPAQUE  IOHEXOL  350 MG/ML SOLN COMPARISON:  05/27/2023, 05/22/2023 FINDINGS: VASCULAR Aorta: Normal caliber aorta without aneurysm, dissection, vasculitis or significant stenosis. Celiac: Patent without evidence of aneurysm, dissection, vasculitis or significant stenosis. SMA: Patent without evidence of aneurysm, dissection, vasculitis or significant stenosis. Renals: Both renal arteries are patent without evidence of aneurysm, dissection, vasculitis, fibromuscular dysplasia or significant stenosis. There is a small accessory left renal artery supplying a portion of the lower pole, which is widely patent. IMA: Patent without evidence of aneurysm, dissection, vasculitis or significant stenosis. Inflow: Patent without evidence of aneurysm, dissection, vasculitis or significant stenosis. Proximal Outflow: Bilateral common femoral and visualized portions of the superficial and profunda femoral arteries are patent without evidence of aneurysm, dissection, vasculitis or significant stenosis. Veins: No obvious venous abnormality within the limitations of this arterial phase study. Review of the MIP images confirms the above findings. NON-VASCULAR Lower chest: No acute pleural or parenchymal lung disease. Hepatobiliary: Multiple calcified gallstones are identified within the gallbladder lumen, with  no evidence of acute cholecystitis. Interval passage or removal of the choledocholithiasis noted on prior exam, with resolution of the biliary duct dilation. The liver is unremarkable. Pancreas: Unremarkable. No pancreatic ductal dilatation or surrounding inflammatory changes. Spleen: Normal in size without focal abnormality. Adrenals/Urinary Tract: Adrenal glands are unremarkable. Kidneys are normal, without renal calculi, focal lesion, or hydronephrosis. Bladder is  unremarkable. Stomach/Bowel: No bowel obstruction or ileus. Normal appendix right lower quadrant. Scattered diverticulosis through the distal colon without evidence of acute diverticulitis. No bowel wall thickening or inflammatory change. There is no evidence of intraluminal contrast accumulation to suggest active gastrointestinal bleeding. Lymphatic: No pathologic adenopathy within the abdomen or pelvis. Reproductive: Prostate is unremarkable. Other: No free fluid or free intraperitoneal gas. No abdominal wall hernia. Musculoskeletal: No acute or destructive bony abnormalities. Reconstructed images demonstrate no additional findings. IMPRESSION: VASCULAR 1. No evidence of active gastrointestinal bleeding. 2. No significant vascular findings. NON-VASCULAR 1. Cholelithiasis without evidence of acute cholecystitis. Interval removal or passage of the choledocholithiasis seen previously, with resolution of the mild biliary duct dilation noted on prior MRI. 2. Minimal distal colonic diverticulosis without diverticulitis. Electronically Signed   By: Bobbye Burrow M.D.   On: 05/27/2023 17:32   US  Abdomen Limited RUQ (LIVER/GB) Result Date: 05/27/2023 CLINICAL DATA:  151471 RUQ pain 151471 EXAM: ULTRASOUND ABDOMEN LIMITED RIGHT UPPER QUADRANT COMPARISON:  MRI abdomen from 05/22/2023. FINDINGS: The technologist noted limited exam due to patient related factors. Patient was not able to hold breath during the exam. Gallbladder: Physiologically distended. No abnormal wall thickening. Small to moderate volume gallstones and sludge noted. No pericholecystic free fluid. Sonographic Murphy sign was negative as per the technologist. Common bile duct: Diameter: Up to 6.6 mm.  No intrahepatic bile duct dilation. Liver: No focal lesion identified. Within normal limits in parenchymal echogenicity. Portal vein is patent on color Doppler imaging with normal direction of blood flow towards the liver. Other: None. IMPRESSION: 1.  Cholelithiasis without acute cholecystitis. 2. Otherwise unremarkable exam. Electronically Signed   By: Beula Brunswick M.D.   On: 05/27/2023 15:29   DG C-Arm 1-60 Min-No Report Result Date: 05/24/2023 Fluoroscopy was utilized by the requesting physician.  No radiographic interpretation.   MR ABDOMEN MRCP W WO CONTAST Result Date: 05/23/2023 CLINICAL DATA:  Right upper quadrant abdominal pain. Evaluate for choledocholithiasis. EXAM: MRI ABDOMEN WITHOUT AND WITH CONTRAST (INCLUDING MRCP) TECHNIQUE: Multiplanar multisequence MR imaging of the abdomen was performed both before and after the administration of intravenous contrast. Heavily T2-weighted images of the biliary and pancreatic ducts were obtained, and three-dimensional MRCP images were rendered by post processing. CONTRAST:  10mL GADAVIST  GADOBUTROL  1 MMOL/ML IV SOLN COMPARISON:  None Available. FINDINGS: Lower chest: No acute findings. Hepatobiliary: No focal liver abnormality. Multiple stones identified within the gallbladder. The largest stone is in the neck measuring 1.1 cm, image 17/9. Gallbladder appears decompressed. No significant gallbladder wall thickening or pericholecystic inflammation. Multiple stones identified within the mid and distal common bile duct which measure up to 9 mm. Mild fusiform dilatation of the common bile duct measures up to 8 mm at the level of the mid duct. Mild central biliary dilatation. Pancreas: No mass, inflammatory changes, or other parenchymal abnormality identified. Spleen:  Within normal limits in size and appearance. Adrenals/Urinary Tract: No masses identified. No evidence of hydronephrosis. Stomach/Bowel: The stomach appears normal. There is no dilated loops of large or small bowel. Vascular/Lymphatic: No pathologically enlarged lymph nodes identified. No abdominal aortic aneurysm demonstrated. Other:  No ascites  or focal fluid collections. Musculoskeletal: No suspicious bone lesions identified. IMPRESSION: 1.  Cholelithiasis without secondary signs of acute cholecystitis. 2. Choledocholithiasis with mild fusiform dilatation of the common bile duct and mild central biliary dilatation. The mid common bile duct measures up to 8 mm. Electronically Signed   By: Kimberley Penman M.D.   On: 05/23/2023 03:57   MR 3D Recon At Scanner Result Date: 05/23/2023 CLINICAL DATA:  Right upper quadrant abdominal pain. Evaluate for choledocholithiasis. EXAM: MRI ABDOMEN WITHOUT AND WITH CONTRAST (INCLUDING MRCP) TECHNIQUE: Multiplanar multisequence MR imaging of the abdomen was performed both before and after the administration of intravenous contrast. Heavily T2-weighted images of the biliary and pancreatic ducts were obtained, and three-dimensional MRCP images were rendered by post processing. CONTRAST:  10mL GADAVIST  GADOBUTROL  1 MMOL/ML IV SOLN COMPARISON:  None Available. FINDINGS: Lower chest: No acute findings. Hepatobiliary: No focal liver abnormality. Multiple stones identified within the gallbladder. The largest stone is in the neck measuring 1.1 cm, image 17/9. Gallbladder appears decompressed. No significant gallbladder wall thickening or pericholecystic inflammation. Multiple stones identified within the mid and distal common bile duct which measure up to 9 mm. Mild fusiform dilatation of the common bile duct measures up to 8 mm at the level of the mid duct. Mild central biliary dilatation. Pancreas: No mass, inflammatory changes, or other parenchymal abnormality identified. Spleen:  Within normal limits in size and appearance. Adrenals/Urinary Tract: No masses identified. No evidence of hydronephrosis. Stomach/Bowel: The stomach appears normal. There is no dilated loops of large or small bowel. Vascular/Lymphatic: No pathologically enlarged lymph nodes identified. No abdominal aortic aneurysm demonstrated. Other:  No ascites or focal fluid collections. Musculoskeletal: No suspicious bone lesions identified. IMPRESSION: 1.  Cholelithiasis without secondary signs of acute cholecystitis. 2. Choledocholithiasis with mild fusiform dilatation of the common bile duct and mild central biliary dilatation. The mid common bile duct measures up to 8 mm. Electronically Signed   By: Kimberley Penman M.D.   On: 05/23/2023 03:57   CT Head Wo Contrast Result Date: 05/22/2023 CLINICAL DATA:  Headache sudden, severe. EXAM: CT HEAD WITHOUT CONTRAST TECHNIQUE: Contiguous axial images were obtained from the base of the skull through the vertex without intravenous contrast. RADIATION DOSE REDUCTION: This exam was performed according to the departmental dose-optimization program which includes automated exposure control, adjustment of the mA and/or kV according to patient size and/or use of iterative reconstruction technique. COMPARISON:  None Available. FINDINGS: Brain: No acute infarct, hemorrhage, or mass lesion is present. No significant white matter lesions are present. Deep brain nuclei are within normal limits. The ventricles are of normal size. No significant extraaxial fluid collection is present. The brainstem and cerebellum are within normal limits. Midline structures are within normal limits. Vascular: No hyperdense vessel or unexpected calcification. Skull: Calvarium is intact. No focal lytic or blastic lesions are present. Left supraorbital frontal scalp soft tissue swelling is present. No underlying fracture or foreign body is present. No other significant extracranial soft tissue injury is present. Sinuses/Orbits: A small polyp or mucous retention cyst is present posteriorly in the right sphenoid sinus. The paranasal sinuses and mastoid air cells are otherwise clear. The globes and orbits are within normal limits. IMPRESSION: 1. Left supraorbital frontal scalp soft tissue swelling without underlying fracture or foreign body. 2. Normal CT appearance of the brain. 3. Small polyp or mucous retention cyst posteriorly in the right sphenoid sinus.  Electronically Signed   By: Audree Leas M.D.   On: 05/22/2023 16:33  US  Venous Img Lower Unilateral Left Result Date: 05/22/2023 CLINICAL DATA:  Left lower extremity pain and swelling. EXAM: Left LOWER EXTREMITY VENOUS DOPPLER ULTRASOUND TECHNIQUE: Gray-scale sonography with compression, as well as color and duplex ultrasound, were performed to evaluate the deep venous system(s) from the level of the common femoral vein through the popliteal and proximal calf veins. COMPARISON:  None Available. FINDINGS: VENOUS Normal compressibility of the common femoral, superficial femoral, and popliteal veins, as well as the visualized calf veins. Visualized portions of profunda femoral vein and great saphenous vein unremarkable. No filling defects to suggest DVT on grayscale or color Doppler imaging. Doppler waveforms show normal direction of venous flow, normal respiratory plasticity and response to augmentation. Limited views of the contralateral common femoral vein are unremarkable. OTHER None. Limitations: none IMPRESSION: Negative left lower extremity venous Doppler ultrasound.  No DVT. Electronically Signed   By: Audree Leas M.D.   On: 05/22/2023 16:28   CT Angio Chest PE W/Cm &/Or Wo Cm Result Date: 05/22/2023 CLINICAL DATA:  Right-sided chest pain.  Left-sided leg pain. EXAM: CT ANGIOGRAPHY CHEST WITH CONTRAST TECHNIQUE: Multidetector CT imaging of the chest was performed using the standard protocol during bolus administration of intravenous contrast. Multiplanar CT image reconstructions and MIPs were obtained to evaluate the vascular anatomy. RADIATION DOSE REDUCTION: This exam was performed according to the departmental dose-optimization program which includes automated exposure control, adjustment of the mA and/or kV according to patient size and/or use of iterative reconstruction technique. CONTRAST:  75mL OMNIPAQUE  IOHEXOL  350 MG/ML SOLN COMPARISON:  09/01/2016 chest radiograph from [facility]. No comparison CT. FINDINGS: Cardiovascular: The quality of this exam for evaluation of pulmonary embolism is sufficient. Isolated filling defect within the posterior right upper lobe segmental and subsegmental pulmonary artery branches, relatively occlusive including on 136/9 and coronal image 78. Normal aortic caliber. Mild cardiomegaly, without pericardial effusion. Mediastinum/Nodes: Borderline to mild right hilar adenopathy is likely reactive at 1.4 cm. No mediastinal or left hilar adenopathy. Lungs/Pleura: Trace right pleural fluid. Mild paraseptal and centrilobular emphysema. Posterior right upper lobe pleural-based airspace opacity including on 34/8. 3 mm pleural-based left upper lobe pulmonary nodule on 31/8. Upper Abdomen: Multiple small gallstones. Focal steatosis adjacent the falciform ligament. Normal imaged portions of the spleen, stomach, pancreas, adrenal glands, kidneys Although there is no significant common duct dilatation, choledocholithiasis is seen including stones of up to 5 mm on 146/7 and coronal image 63. Musculoskeletal: No acute osseous abnormality. Minimal convex left thoracic spine curvature. Review of the MIP images confirms the above findings. IMPRESSION: 1. Posterior right upper lobe segmental to subsegmental pulmonary embolism, likely occlusive. Posterior right upper lobe infarct. 2. Trace right pleural fluid 3. Cholelithiasis. Choledocholithiasis without significant biliary duct dilatation. 4. Emphysema (ICD10-J43.9). left upper lobe 3 mm pulmonary nodule. No follow-up needed if patient is low-risk. Non-contrast chest CT can be considered in 12 months if patient is high-risk, nodule is upper lobe, and/or suspicious in morphology. This recommendation follows the consensus statement: Guidelines for Management of Incidental Pulmonary Nodules Detected on CT Images: From the Fleischner Society 2017; Radiology 2017; 284:228-243. Critical test results telephoned to . Dr. Cleora Daft. At  the time of interpretation at . 3:45 p.m.On . 05/22/2023. Electronically Signed   By: Lore Rode M.D.   On: 05/22/2023 15:44    Microbiology: Results for orders placed or performed during the hospital encounter of 08/10/18  SARS Coronavirus 2 (CEPHEID - Performed in [facility] hospital lab), [facility] Order     Status:  None   Collection Time: 08/10/18  1:32 PM   Specimen: Nasopharyngeal Swab  Result Value Ref Range Status   SARS Coronavirus 2 NEGATIVE NEGATIVE Final    Comment: (NOTE) If result is NEGATIVE SARS-CoV-2 target nucleic acids are NOT DETECTED. The SARS-CoV-2 RNA is generally detectable in upper and lower  respiratory specimens during the acute phase of infection. The lowest  concentration of SARS-CoV-2 viral copies this assay can detect is 250  copies / mL. A negative result does not preclude SARS-CoV-2 infection  and should not be used as the sole basis for treatment or other  patient management decisions.  A negative result may occur with  improper specimen collection / handling, submission of specimen other  than nasopharyngeal swab, presence of viral mutation(s) within the  areas targeted by this assay, and inadequate number of viral copies  (<250 copies / mL). A negative result must be combined with clinical  observations, patient history, and epidemiological information. If result is POSITIVE SARS-CoV-2 target nucleic acids are DETECTED. The SARS-CoV-2 RNA is generally detectable in upper and lower  respiratory specimens dur ing the acute phase of infection.  Positive  results are indicative of active infection with SARS-CoV-2.  Clinical  correlation with patient history and other diagnostic information is  necessary to determine patient infection status.  Positive results do  not rule out bacterial infection or co-infection with other viruses. If result is PRESUMPTIVE POSTIVE SARS-CoV-2 nucleic acids MAY BE PRESENT.   A presumptive positive result was obtained on  the submitted specimen  and confirmed on repeat testing.  While 2019 novel coronavirus  (SARS-CoV-2) nucleic acids may be present in the submitted sample  additional confirmatory testing may be necessary for epidemiological  and / or clinical management purposes  to differentiate between  SARS-CoV-2 and other Sarbecovirus currently known to infect humans.  If clinically indicated additional testing with an alternate test  methodology [phone]) is advised. The SARS-CoV-2 RNA is generally  detectable in upper and lower respiratory sp ecimens during the acute  phase of infection. The expected result is Negative. Fact Sheet for Patients:  BoilerBrush[employer].cy Fact Sheet for Healthcare Providers: https://pope.com/ This test is not yet approved or cleared by the [place]  FDA and has been authorized for detection and/or diagnosis of SARS-CoV-2 by FDA under an Emergency Use Authorization (EUA).  This EUA will remain in effect (meaning this test can be used) for the duration of the COVID-19 declaration under Section 564(b)(1) of the Act, 21 U.S.C. section 360bbb-3(b)(1), unless the authorization is terminated or revoked sooner. Performed at [facility], [address] [place], [place] [address]     Labs: CBC: Recent Labs  Lab 05/27/23 1326 05/27/23 2118 05/28/23 0113 05/28/23 0725 05/28/23 1241 05/29/23 0337  WBC 4.6 4.7 4.3 3.6* 4.2 3.7*  NEUTROABS 3.1  --   --   --   --   --   HGB 14.1 12.8* 12.7* 13.3 13.3 12.7*  HCT 43.3 39.0 38.9* 40.8 40.5 38.7*  MCV 88.2 88.4 88.6 87.7 87.5 88.2  PLT 206 180 176 182 179 182   Basic Metabolic Panel: Recent Labs  Lab 05/25/23 1233 05/27/23 1326 05/28/23 0725  NA 137 135 139  K 4.0 3.6 3.8  CL 102 103 106  CO2 28 23 25   GLUCOSE 96 89 87  BUN 8 12 12   CREATININE 0.84 0.83 0.73  CALCIUM 9.0 8.9 8.8*   Liver Function Tests: Recent Labs  Lab 05/25/23 1233 05/27/23 1326  AST 20 38  ALT 15 35  ALKPHOS 67 67  BILITOT 0.7 0.9  PROT 6.9 7.6  ALBUMIN 3.6 4.1   CBG: Recent Labs  Lab 05/29/23 1305  GLUCAP 125*    Discharge time spent: greater than 30 minutes.  Signed: Montey Apa, DO [facility] 05/31/2023

## 2023-05-31 NOTE — TOC Transition Note (Signed)
 Transition of Care [facility]) - Discharge Note   Patient Details  Name: Todd Ware MRN: [id number] Date of Birth: [birth year]  [facility] CM/SW Contact:  Crayton Docker, RN 05/31/2023, 1:16 PM   Clinical Narrative:     Discharge orders noted for home/self care. CM to patient's room regarding pending discharge. Patient requests taxi voucher for [employer], [address], [place], [place]. CM placed call to [employer], for transportation sponsorship. Taxi voucher sponsorship form completed for pick up at [facility] entrance at 1500. Patient also provided number to [facility] for future appointment needs, added to AVS.   Final next level of care: Home/Self Care Barriers to Discharge: No Barriers Identified   Patient Goals and CMS Choice    Home/self care     Discharge Placement     Home/self care           Discharge Plan and Services Additional resources added to the After Visit Summary for     Home/self care    Social Drivers of Health (SDOH) Interventions SDOH Screenings   Food Insecurity: Food Insecurity Present (05/28/2023)  Housing: High Risk (05/28/2023)  Transportation Needs: No Transportation Needs (05/28/2023)  Recent Concern: Transportation Needs - Unmet Transportation Needs (05/22/2023)  Utilities: Not At Risk (05/28/2023)  Alcohol Screen: Medium Risk (08/10/2018)  Tobacco Use: High Risk (05/28/2023)     Readmission Risk Interventions     No data to display

## 2023-05-31 NOTE — Discharge Instr - Supplementary Instructions (Signed)
 [facility] will only coordinate transportation for [employer] members. To apply for Non Emergency Medical Transportation services or to schedule a ride, call [phone]. If you receive a recording, please leave only your name and telephone number and your call will be returned.e, call [phone].

## 2023-05-31 NOTE — Plan of Care (Signed)

## 2023-05-31 NOTE — Plan of Care (Signed)

## 2023-05-31 NOTE — Discharge Instructions (Signed)
 Your nurse navigator, Cleotilde Spadaccini, can be reached at [phone]

## 2023-05-31 NOTE — Progress Notes (Signed)
 Met with patient at bedside. Patient will likely discharge back to the streets today. He is NOT eligible for the homeless shelter (per Jai) due to him being recently released from prison.  Patient has been provided with a note for parole officer, per request.   Appointments made for the following: Establishment of PCP care, eye appointment at [facility], and psychiatry.   Patient encouraged to use Medicaid transportation to get to his upcoming appointments.  Patient to see Dr. Randy Buttery today at 1:30pm IF discharged in time. Alternate appointment to be made if not.   Patient to go to [place] [facility] TOMORROW to be connected to services.   Encouraged to reach out with questions or concerns.

## 2023-06-01 ENCOUNTER — Encounter: Payer: Self-pay | Admitting: Oncology

## 2023-06-01 ENCOUNTER — Telehealth: Payer: Self-pay

## 2023-06-01 LAB — LUPUS ANTICOAGULANT
DRVVT: 47.1 s — ABNORMAL HIGH (ref 0.0–47.0)
PTT Lupus Anticoagulant: 37.1 s (ref 0.0–43.5)
Thrombin Time: 24.1 s — ABNORMAL HIGH (ref 0.0–23.0)
dPT Confirm Ratio: 1.52 ratio — ABNORMAL HIGH (ref 0.00–1.34)
dPT: 46.4 s (ref 0.0–47.6)

## 2023-06-01 LAB — PROTEIN C, TOTAL: Protein C, Total: 124 % (ref 60–150)

## 2023-06-01 LAB — PROTEIN S PANEL
Protein S Activity: 102 % (ref 63–140)
Protein S Ag, Free: 119 % (ref 61–136)
Protein S Ag, Total: 78 % (ref 60–150)

## 2023-06-01 LAB — DRVVT MIX: dRVVT Mix: 39.8 s (ref 0.0–40.4)

## 2023-06-01 LAB — BETA-2-GLYCOPROTEIN I ABS, IGG/M/A
Beta-2 Glyco I IgG: <9 GPI IgG units (ref 0–20)
Beta-2-Glycoprotein I IgA: 93 GPI IgA units — ABNORMAL HIGH (ref 0–25)
Beta-2-Glycoprotein I IgM: 10 GPI IgM units (ref 0–32)

## 2023-06-01 LAB — HEX PHASE PHOSPHOLIPID REFLEX

## 2023-06-01 LAB — HEXAGONAL PHASE PHOSPHOLIPID: Hex Phosph Neut Test: 4 s (ref 0–11)

## 2023-06-01 NOTE — Progress Notes (Signed)
 Hematology/Oncology Consult note [facility] Telephone:([phone] [phone]  Patient Care Team: Pcp, No as PCP - General Patient, No Pcp Per (General Practice) Avonne Boettcher, MD as Consulting Physician (Hematology and Oncology) Avonne Boettcher, MD as Consulting Physician (Oncology)   Name of the patient: Todd Ware  [id number]  [birth year]    Reason for referral-history of pulmonary embolism   Referring physician-Dr. Sheril Dines  Date of visit: 06/01/23   History of presenting illness- patient is a 38 year old male with a past medical history significant for bipolar disorder and tobacco abuse who presented to the ER in April 2025 with symptoms of exertional chest pain and fatigue.  He underwent CT angio chest which showed posterior right upper lobe segmental to subsegmental pulmonary embolus likely occlusive.  Findings of emphysema and 3 mm left upper lobe lung nodule.  At that time he was complaining of left lower extremity pain and therefore underwent lower extremity Doppler which was negative for DVT.  He was discharged on Eliquis .  During the same hospitalization patient also underwent MRI abdomen with MRCP for evaluation of abdominal pain which showed evidence of gallstones and choledocholithiasis but no other acute pathology.  5 days after patient was discharged on Eliquis  he presented with symptoms of melena and black tarry stools.  His hemoglobin which was 14.3 on 05/25/2023 had dropped to 12.8 and therefore there was concern for possible rectal/GI bleeding.  CT angio gram showed no evidence of active GI bleeding.  He underwent upper endoscopy by Dr. Corky Diener which was normal and showed no evidence of bleeding.  Patient's Eliquis  was stopped and patient had an IVC filter placed.  Patient was complaining of groinAnd since the time he had the IVC filter and therefore underwent ultrasound scrotum as well as ultrasound soft tissue lower extremity which was  unremarkable  ECOG PS- 1  Pain scale- 3   Review of systems- Review of Systems  Constitutional:  Negative for chills, fever, malaise/fatigue and weight loss.  HENT:  Negative for congestion, ear discharge and nosebleeds.   Eyes:  Negative for blurred vision.  Respiratory:  Negative for cough, hemoptysis, sputum production, shortness of breath and wheezing.   Cardiovascular:  Negative for chest pain, palpitations, orthopnea and claudication.  Gastrointestinal:  Negative for abdominal pain, blood in stool, constipation, diarrhea, heartburn, melena, nausea and vomiting.  Genitourinary:  Negative for dysuria, flank pain, frequency, hematuria and urgency.       Right-sided groin pain  Musculoskeletal:  Negative for back pain, joint pain and myalgias.  Skin:  Negative for rash.  Neurological:  Negative for dizziness, tingling, focal weakness, seizures, weakness and headaches.  Endo/Heme/Allergies:  Does not bruise/bleed easily.  Psychiatric/Behavioral:  Negative for depression and suicidal ideas. The patient does not have insomnia.     No Known Allergies  Patient Active Problem List   Diagnosis Date Noted   Abdominal pain 05/27/2023   Obesity (BMI 30-39.9) 05/27/2023   Homeless 05/27/2023   Cholelithiasis 05/27/2023   Diarrhea 05/27/2023   Pulmonary embolism (HCC) 05/22/2023   Choledocholithiasis 05/22/2023   Tobacco abuse 05/22/2023   Polysubstance abuse (HCC) 05/22/2023   Bipolar depression (HCC) 08/11/2018   Bipolar 1 disorder with moderate mania (HCC) 08/10/2018     Past Medical History:  Diagnosis Date   Melena 05/28/2023   Rectal bleeding 05/27/2023   Seizures (HCC)      Past Surgical History:  Procedure Laterality Date   ERCP N/A 05/24/2023   Procedure: ERCP, WITH  INTERVENTION IF INDICATED;  Surgeon: Marnee Sink, MD;  Location: [facility] ENDOSCOPY;  Service: Endoscopy;  Laterality: N/A;   ESOPHAGOGASTRODUODENOSCOPY N/A 05/30/2023   Procedure: EGD  (ESOPHAGOGASTRODUODENOSCOPY);  Surgeon: Toledo, Alphonsus Jeans, MD;  Location: [facility] ENDOSCOPY;  Service: Gastroenterology;  Laterality: N/A;   FACIAL FRACTURE SURGERY     IVC FILTER INSERTION N/A 05/28/2023   Procedure: IVC FILTER INSERTION;  Surgeon: Celso College, MD;  Location: [facility] INVASIVE CV LAB;  Service: Cardiovascular;  Laterality: N/A;   SPYGLASS LITHOTRIPSY  05/24/2023   Procedure: ERCP, WITH CHOLANGIOSCOPY AND LITHOTRIPSY;  Surgeon: Marnee Sink, MD;  Location: [facility] ENDOSCOPY;  Service: Endoscopy;;    Social History   Socioeconomic History   Marital status: Single    Spouse name: Not on file   Number of children: Not on file   Years of education: Not on file   Highest education level: Not on file  Occupational History   Not on file  Tobacco Use   Smoking status: Every Day    Types: Cigarettes   Smokeless tobacco: Never  Vaping Use   Vaping status: Some Days   Substances: Nicotine , THC  Substance and Sexual Activity   Alcohol use: Not Currently    Comment: social drinker   Drug use: Yes    Types: Marijuana    Comment: molly last week   Sexual activity: Yes  Other Topics Concern   Not on file  Social History Narrative   ** Merged History Encounter **       Social Drivers of Health   Financial Resource Strain: Not on file  Food Insecurity: Food Insecurity Present (05/28/2023)   Hunger Vital Sign    Worried About Running Out of Food in the Last Year: Sometimes true    Ran Out of Food in the Last Year: Sometimes true  Transportation Needs: No Transportation Needs (05/28/2023)   PRAPARE - Administrator, Civil Service (Medical): No    Lack of Transportation (Non-Medical): No  Recent Concern: Transportation Needs - Unmet Transportation Needs (05/22/2023)   PRAPARE - Administrator, Civil Service (Medical): Yes    Lack of Transportation (Non-Medical): Yes  Physical Activity: Not on file  Stress: Not on file  Social Connections: Not on file  Intimate  Partner Violence: Not At Risk (05/28/2023)   Humiliation, Afraid, Rape, and Kick questionnaire    Fear of Current or Ex-Partner: No    Emotionally Abused: No    Physically Abused: No    Sexually Abused: No     Family History  Problem Relation Age of Onset   Healthy Sister    Healthy Brother    Healthy Brother    Cancer Maternal Aunt    Cancer Maternal Uncle    Cancer Paternal Aunt    Cancer Paternal Aunt    Cancer Paternal Aunt    Cancer Maternal Grandmother    Colon cancer Maternal Grandfather      Current Outpatient Medications:    [Paused] APIXABAN  (ELIQUIS ) VTE STARTER PACK (10MG  AND 5MG ), Take as directed on package: start with two-5mg  tablets twice daily for 7 days. On day 8, switch to one-5mg  tablet twice daily., Disp: 74 each, Rfl: 0   nicotine  (NICODERM CQ  - DOSED IN MG/24 HOURS) 21 mg/24hr patch, Place 1 patch (21 mg total) onto the skin daily., Disp: 28 patch, Rfl: 0   oxyCODONE  (OXY IR/ROXICODONE ) 5 MG immediate release tablet, Take 1-2 tablets (5-10 mg total) by mouth every 6 (six) hours  as needed for moderate pain (pain score 4-6) or severe pain (pain score 7-10)., Disp: 20 tablet, Rfl: 0   polyethylene glycol powder (MIRALAX) 17 GM/SCOOP powder, Take 17 g by mouth 2 (two) times daily as needed., Disp: 238 g, Rfl: 1   prazosin  (MINIPRESS ) 1 MG capsule, Take 1 capsule (1 mg total) by mouth at bedtime., Disp: 30 capsule, Rfl: 0   senna-docusate (SENOKOT-S) 8.6-50 MG tablet, Take 1 tablet by mouth 2 (two) times daily as needed for mild constipation or moderate constipation., Disp: 60 tablet, Rfl: 0   sertraline  (ZOLOFT ) 25 MG tablet, Take 1 tablet (25 mg total) by mouth daily., Disp: 30 tablet, Rfl: 0   Physical exam:  Vitals:   05/31/23 1346  BP: 128/80  Pulse: 84  Resp: 18  Temp: 98.8 F (37.1 C)  TempSrc: Tympanic  SpO2: 95%  Weight: 243 lb (110.2 kg)  Height: 6' (1.829 m)   Physical Exam Cardiovascular:     Rate and Rhythm: Normal rate and regular rhythm.      Heart sounds: Normal heart sounds.  Pulmonary:     Effort: Pulmonary effort is normal.     Breath sounds: Normal breath sounds.  Abdominal:     General: Bowel sounds are normal.     Palpations: Abdomen is soft.  Musculoskeletal:     Right lower leg: No edema.     Left lower leg: No edema.  Skin:    General: Skin is warm and dry.  Neurological:     Mental Status: He is alert and oriented to person, place, and time.           Latest Ref Rng & Units 05/28/2023    7:25 AM  CMP  Glucose 70 - 99 mg/dL 87   BUN 6 - 20 mg/dL 12   Creatinine 8.29 - 1.24 mg/dL 5.62   Sodium 130 - 865 mmol/L 139   Potassium 3.5 - 5.1 mmol/L 3.8   Chloride 98 - 111 mmol/L 106   CO2 22 - 32 mmol/L 25   Calcium 8.9 - 10.3 mg/dL 8.8       Latest Ref Rng & Units 05/31/2023    2:24 PM  CBC  WBC 4.0 - 10.5 K/uL 8.1   Hemoglobin 13.0 - 17.0 g/dL 78.4   Hematocrit 69.6 - 52.0 % 43.9   Platelets 150 - 400 K/uL 200     No images are attached to the encounter.  US  RT LOWER EXTREM LTD SOFT TISSUE NON VASCULAR Result Date: 05/31/2023 CLINICAL DATA:  Groin pain, hematoma versus seroma EXAM: ULTRASOUND right LOWER EXTREMITY LIMITED TECHNIQUE: Ultrasound examination of the lower extremity soft tissues was performed in the area of clinical concern. COMPARISON:  None Available. FINDINGS: Joint Space: No effusion. Muscles: Normal. Tendons: Normal Other Soft Tissue Structures: No hematomas, no seromas Right groin normal-appearing lymph nodes measuring 1.6 x 0.5 x 0.9 cm IMPRESSION: No hematomas, no seromas. Electronically Signed   By: Fredrich Jefferson M.D.   On: 05/31/2023 11:07   US  SCROTUM W/DOPPLER Result Date: 05/30/2023 CLINICAL DATA:  Right testicular pain following IVC filter placement EXAM: SCROTAL ULTRASOUND DOPPLER ULTRASOUND OF THE TESTICLES TECHNIQUE: Complete ultrasound examination of the testicles, epididymis, and other scrotal structures was performed. Color and spectral Doppler ultrasound were also  utilized to evaluate blood flow to the testicles. COMPARISON:  None Available. FINDINGS: Right testicle Measurements: 4.5 x 2.3 x 3.4 cm. No mass or microlithiasis visualized. Left testicle Measurements: 4.2 x 2.5 x 3.2 cm.  No mass or microlithiasis visualized. Right epididymis:  Normal in size and appearance. Left epididymis:  Normal in size and appearance. Hydrocele:  None visualized. Varicocele:  None visualized. Pulsed Doppler interrogation of both testes demonstrates normal low resistance arterial and venous waveforms bilaterally. IMPRESSION: No acute abnormality noted. Electronically Signed   By: Violeta Grey M.D.   On: 05/30/2023 19:15   PERIPHERAL VASCULAR CATHETERIZATION Result Date: 05/28/2023 See surgical note for result.  CT ANGIO GI BLEED Result Date: 05/27/2023 CLINICAL DATA:  Left upper quadrant abdominal pain, nausea, history of cholelithiasis and choledocholithiasis EXAM: CTA ABDOMEN AND PELVIS WITHOUT AND WITH CONTRAST TECHNIQUE: Multidetector CT imaging of the abdomen and pelvis was performed using the standard protocol during bolus administration of intravenous contrast. Multiplanar reconstructed images and MIPs were obtained and reviewed to evaluate the vascular anatomy. RADIATION DOSE REDUCTION: This exam was performed according to the departmental dose-optimization program which includes automated exposure control, adjustment of the mA and/or kV according to patient size and/or use of iterative reconstruction technique. CONTRAST:  OMNIPAQUE  IOHEXOL  350 MG/ML SOLN COMPARISON:  05/27/2023, 05/22/2023 FINDINGS: VASCULAR Aorta: Normal caliber aorta without aneurysm, dissection, vasculitis or significant stenosis. Celiac: Patent without evidence of aneurysm, dissection, vasculitis or significant stenosis. SMA: Patent without evidence of aneurysm, dissection, vasculitis or significant stenosis. Renals: Both renal arteries are patent without evidence of aneurysm, dissection, vasculitis,  fibromuscular dysplasia or significant stenosis. There is a small accessory left renal artery supplying a portion of the lower pole, which is widely patent. IMA: Patent without evidence of aneurysm, dissection, vasculitis or significant stenosis. Inflow: Patent without evidence of aneurysm, dissection, vasculitis or significant stenosis. Proximal Outflow: Bilateral common femoral and visualized portions of the superficial and profunda femoral arteries are patent without evidence of aneurysm, dissection, vasculitis or significant stenosis. Veins: No obvious venous abnormality within the limitations of this arterial phase study. Review of the MIP images confirms the above findings. NON-VASCULAR Lower chest: No acute pleural or parenchymal lung disease. Hepatobiliary: Multiple calcified gallstones are identified within the gallbladder lumen, with no evidence of acute cholecystitis. Interval passage or removal of the choledocholithiasis noted on prior exam, with resolution of the biliary duct dilation. The liver is unremarkable. Pancreas: Unremarkable. No pancreatic ductal dilatation or surrounding inflammatory changes. Spleen: Normal in size without focal abnormality. Adrenals/Urinary Tract: Adrenal glands are unremarkable. Kidneys are normal, without renal calculi, focal lesion, or hydronephrosis. Bladder is unremarkable. Stomach/Bowel: No bowel obstruction or ileus. Normal appendix right lower quadrant. Scattered diverticulosis through the distal colon without evidence of acute diverticulitis. No bowel wall thickening or inflammatory change. There is no evidence of intraluminal contrast accumulation to suggest active gastrointestinal bleeding. Lymphatic: No pathologic adenopathy within the abdomen or pelvis. Reproductive: Prostate is unremarkable. Other: No free fluid or free intraperitoneal gas. No abdominal wall hernia. Musculoskeletal: No acute or destructive bony abnormalities. Reconstructed images demonstrate no  additional findings. IMPRESSION: VASCULAR 1. No evidence of active gastrointestinal bleeding. 2. No significant vascular findings. NON-VASCULAR 1. Cholelithiasis without evidence of acute cholecystitis. Interval removal or passage of the choledocholithiasis seen previously, with resolution of the mild biliary duct dilation noted on prior MRI. 2. Minimal distal colonic diverticulosis without diverticulitis. Electronically Signed   By: Bobbye Burrow M.D.   On: 05/27/2023 17:32   US  Abdomen Limited RUQ (LIVER/GB) Result Date: 05/27/2023 CLINICAL DATA:  151471 RUQ pain 151471 EXAM: ULTRASOUND ABDOMEN LIMITED RIGHT UPPER QUADRANT COMPARISON:  MRI abdomen from 05/22/2023. FINDINGS: The technologist noted limited exam due to patient related factors.  Patient was not able to hold breath during the exam. Gallbladder: Physiologically distended. No abnormal wall thickening. Small to moderate volume gallstones and sludge noted. No pericholecystic free fluid. Sonographic Murphy sign was negative as per the technologist. Common bile duct: Diameter: Up to 6.6 mm.  No intrahepatic bile duct dilation. Liver: No focal lesion identified. Within normal limits in parenchymal echogenicity. Portal vein is patent on color Doppler imaging with normal direction of blood flow towards the liver. Other: None. IMPRESSION: 1. Cholelithiasis without acute cholecystitis. 2. Otherwise unremarkable exam. Electronically Signed   By: Beula Brunswick M.D.   On: 05/27/2023 15:29   DG C-Arm 1-60 Min-No Report Result Date: 05/24/2023 Fluoroscopy was utilized by the requesting physician.  No radiographic interpretation.   MR ABDOMEN MRCP W WO CONTAST Result Date: 05/23/2023 CLINICAL DATA:  Right upper quadrant abdominal pain. Evaluate for choledocholithiasis. EXAM: MRI ABDOMEN WITHOUT AND WITH CONTRAST (INCLUDING MRCP) TECHNIQUE: Multiplanar multisequence MR imaging of the abdomen was performed both before and after the administration of intravenous  contrast. Heavily T2-weighted images of the biliary and pancreatic ducts were obtained, and three-dimensional MRCP images were rendered by post processing. CONTRAST:  10mL GADAVIST  GADOBUTROL  1 MMOL/ML IV SOLN COMPARISON:  None Available. FINDINGS: Lower chest: No acute findings. Hepatobiliary: No focal liver abnormality. Multiple stones identified within the gallbladder. The largest stone is in the neck measuring 1.1 cm, image 17/9. Gallbladder appears decompressed. No significant gallbladder wall thickening or pericholecystic inflammation. Multiple stones identified within the mid and distal common bile duct which measure up to 9 mm. Mild fusiform dilatation of the common bile duct measures up to 8 mm at the level of the mid duct. Mild central biliary dilatation. Pancreas: No mass, inflammatory changes, or other parenchymal abnormality identified. Spleen:  Within normal limits in size and appearance. Adrenals/Urinary Tract: No masses identified. No evidence of hydronephrosis. Stomach/Bowel: The stomach appears normal. There is no dilated loops of large or small bowel. Vascular/Lymphatic: No pathologically enlarged lymph nodes identified. No abdominal aortic aneurysm demonstrated. Other:  No ascites or focal fluid collections. Musculoskeletal: No suspicious bone lesions identified. IMPRESSION: 1. Cholelithiasis without secondary signs of acute cholecystitis. 2. Choledocholithiasis with mild fusiform dilatation of the common bile duct and mild central biliary dilatation. The mid common bile duct measures up to 8 mm. Electronically Signed   By: Kimberley Penman M.D.   On: 05/23/2023 03:57   MR 3D Recon At Scanner Result Date: 05/23/2023 CLINICAL DATA:  Right upper quadrant abdominal pain. Evaluate for choledocholithiasis. EXAM: MRI ABDOMEN WITHOUT AND WITH CONTRAST (INCLUDING MRCP) TECHNIQUE: Multiplanar multisequence MR imaging of the abdomen was performed both before and after the administration of intravenous  contrast. Heavily T2-weighted images of the biliary and pancreatic ducts were obtained, and three-dimensional MRCP images were rendered by post processing. CONTRAST:  10mL GADAVIST  GADOBUTROL  1 MMOL/ML IV SOLN COMPARISON:  None Available. FINDINGS: Lower chest: No acute findings. Hepatobiliary: No focal liver abnormality. Multiple stones identified within the gallbladder. The largest stone is in the neck measuring 1.1 cm, image 17/9. Gallbladder appears decompressed. No significant gallbladder wall thickening or pericholecystic inflammation. Multiple stones identified within the mid and distal common bile duct which measure up to 9 mm. Mild fusiform dilatation of the common bile duct measures up to 8 mm at the level of the mid duct. Mild central biliary dilatation. Pancreas: No mass, inflammatory changes, or other parenchymal abnormality identified. Spleen:  Within normal limits in size and appearance. Adrenals/Urinary Tract: No masses identified. No evidence  of hydronephrosis. Stomach/Bowel: The stomach appears normal. There is no dilated loops of large or small bowel. Vascular/Lymphatic: No pathologically enlarged lymph nodes identified. No abdominal aortic aneurysm demonstrated. Other:  No ascites or focal fluid collections. Musculoskeletal: No suspicious bone lesions identified. IMPRESSION: 1. Cholelithiasis without secondary signs of acute cholecystitis. 2. Choledocholithiasis with mild fusiform dilatation of the common bile duct and mild central biliary dilatation. The mid common bile duct measures up to 8 mm. Electronically Signed   By: Kimberley Penman M.D.   On: 05/23/2023 03:57   CT Head Wo Contrast Result Date: 05/22/2023 CLINICAL DATA:  Headache sudden, severe. EXAM: CT HEAD WITHOUT CONTRAST TECHNIQUE: Contiguous axial images were obtained from the base of the skull through the vertex without intravenous contrast. RADIATION DOSE REDUCTION: This exam was performed according to the departmental  dose-optimization program which includes automated exposure control, adjustment of the mA and/or kV according to patient size and/or use of iterative reconstruction technique. COMPARISON:  None Available. FINDINGS: Brain: No acute infarct, hemorrhage, or mass lesion is present. No significant white matter lesions are present. Deep brain nuclei are within normal limits. The ventricles are of normal size. No significant extraaxial fluid collection is present. The brainstem and cerebellum are within normal limits. Midline structures are within normal limits. Vascular: No hyperdense vessel or unexpected calcification. Skull: Calvarium is intact. No focal lytic or blastic lesions are present. Left supraorbital frontal scalp soft tissue swelling is present. No underlying fracture or foreign body is present. No other significant extracranial soft tissue injury is present. Sinuses/Orbits: A small polyp or mucous retention cyst is present posteriorly in the right sphenoid sinus. The paranasal sinuses and mastoid air cells are otherwise clear. The globes and orbits are within normal limits. IMPRESSION: 1. Left supraorbital frontal scalp soft tissue swelling without underlying fracture or foreign body. 2. Normal CT appearance of the brain. 3. Small polyp or mucous retention cyst posteriorly in the right sphenoid sinus. Electronically Signed   By: Audree Leas M.D.   On: 05/22/2023 16:33   US  Venous Img Lower Unilateral Left Result Date: 05/22/2023 CLINICAL DATA:  Left lower extremity pain and swelling. EXAM: Left LOWER EXTREMITY VENOUS DOPPLER ULTRASOUND TECHNIQUE: Gray-scale sonography with compression, as well as color and duplex ultrasound, were performed to evaluate the deep venous system(s) from the level of the common femoral vein through the popliteal and proximal calf veins. COMPARISON:  None Available. FINDINGS: VENOUS Normal compressibility of the common femoral, superficial femoral, and popliteal veins,  as well as the visualized calf veins. Visualized portions of profunda femoral vein and great saphenous vein unremarkable. No filling defects to suggest DVT on grayscale or color Doppler imaging. Doppler waveforms show normal direction of venous flow, normal respiratory plasticity and response to augmentation. Limited views of the contralateral common femoral vein are unremarkable. OTHER None. Limitations: none IMPRESSION: Negative left lower extremity venous Doppler ultrasound.  No DVT. Electronically Signed   By: Audree Leas M.D.   On: 05/22/2023 16:28   CT Angio Chest PE W/Cm &/Or Wo Cm Result Date: 05/22/2023 CLINICAL DATA:  Right-sided chest pain.  Left-sided leg pain. EXAM: CT ANGIOGRAPHY CHEST WITH CONTRAST TECHNIQUE: Multidetector CT imaging of the chest was performed using the standard protocol during bolus administration of intravenous contrast. Multiplanar CT image reconstructions and MIPs were obtained to evaluate the vascular anatomy. RADIATION DOSE REDUCTION: This exam was performed according to the departmental dose-optimization program which includes automated exposure control, adjustment of the mA and/or kV according to  patient size and/or use of iterative reconstruction technique. CONTRAST:  75mL OMNIPAQUE  IOHEXOL  350 MG/ML SOLN COMPARISON:  09/01/2016 chest radiograph from [place]. No comparison CT. FINDINGS: Cardiovascular: The quality of this exam for evaluation of pulmonary embolism is sufficient. Isolated filling defect within the posterior right upper lobe segmental and subsegmental pulmonary artery branches, relatively occlusive including on 136/9 and coronal image 78. Normal aortic caliber. Mild cardiomegaly, without pericardial effusion. Mediastinum/Nodes: Borderline to mild right hilar adenopathy is likely reactive at 1.4 cm. No mediastinal or left hilar adenopathy. Lungs/Pleura: Trace right pleural fluid. Mild paraseptal and centrilobular emphysema. Posterior right upper  lobe pleural-based airspace opacity including on 34/8. 3 mm pleural-based left upper lobe pulmonary nodule on 31/8. Upper Abdomen: Multiple small gallstones. Focal steatosis adjacent the falciform ligament. Normal imaged portions of the spleen, stomach, pancreas, adrenal glands, kidneys Although there is no significant common duct dilatation, choledocholithiasis is seen including stones of up to 5 mm on 146/7 and coronal image 63. Musculoskeletal: No acute osseous abnormality. Minimal convex left thoracic spine curvature. Review of the MIP images confirms the above findings. IMPRESSION: 1. Posterior right upper lobe segmental to subsegmental pulmonary embolism, likely occlusive. Posterior right upper lobe infarct. 2. Trace right pleural fluid 3. Cholelithiasis. Choledocholithiasis without significant biliary duct dilatation. 4. Emphysema (ICD10-J43.9). left upper lobe 3 mm pulmonary nodule. No follow-up needed if patient is low-risk. Non-contrast chest CT can be considered in 12 months if patient is high-risk, nodule is upper lobe, and/or suspicious in morphology. This recommendation follows the consensus statement: Guidelines for Management of Incidental Pulmonary Nodules Detected on CT Images: From the Fleischner Society 2017; Radiology 2017; 284:228-243. Critical test results telephoned to . Dr. Cleora Daft. At the time of interpretation at . 3:45 p.m.On . 05/22/2023. Electronically Signed   By: Lore Rode M.D.   On: 05/22/2023 15:44    Assessment and plan- Patient is a 38 y.o. male with history of recent right upper lobe segmental to subsegmental pulmonary embolus referred for further management  Patient was diagnosed with acute pulmonary embolism on 05/22/2023 and was started on Eliquis .  5 days later he presented with symptoms of melena and there was a mild drop in his hemoglobin from 14-12.7.  CT angio gram was negative for any active bleeding, EGD was negative for bleeding.  Eliquis  was stopped but I do not  think it is warranted especially since there has been no significant hemodynamic bleed and the benefits of anticoagulation in the setting of acute pulmonary embolism outweigh risks at this time.  I will monitor his CBC withIf closely with a repeat CBC in 2 weeks in 4 weeks and see him back in 4 weeks.  I am also ordering hypercoagulable workup today and I will discuss this in detail when I see him back in 4 weeks.  With regards to placement of IVC filter-the value of this in the absence of known bilateral lower extremity DVT is not strong but nevertheless occult DVTs can be seen in the lower extremities as well as pelvis despite negative Dopplers.  If there are no further episodes of hemodynamically significant GI bleeding patient is able to continue with anticoagulation uninterrupted for the next 6 months I would like his IVC filter to be taken out after that.  PE appears to be unprovoked at this time but given patient's young age I would like to get him off anticoagulation in 6 months if possible   Total face to face encounter time for this patient visit was  40 min.  >50% of the time was spent in counseling and discussing treatment management as well as review of recent CT scans and lab results    Thank you for this kind referral and the opportunity to participate in the care of this patient   Visit Diagnosis 1. Other acute pulmonary embolism without acute cor pulmonale (HCC)     Dr. Seretha Dance, MD, MPH [facility] [phone] 06/01/2023

## 2023-06-02 LAB — CARDIOLIPIN ANTIBODIES, IGG, IGM, IGA
Anticardiolipin IgA: <9 U/mL (ref 0–11)
Anticardiolipin IgG: <9 GPL U/mL (ref 0–14)
Anticardiolipin IgM: 21 [MPL'U]/mL — ABNORMAL HIGH (ref 0–12)

## 2023-06-03 ENCOUNTER — Ambulatory Visit: Admitting: Psychiatry

## 2023-06-04 ENCOUNTER — Telehealth: Payer: Self-pay

## 2023-06-04 LAB — PROTHROMBIN GENE MUTATION

## 2023-06-04 LAB — FACTOR 5 LEIDEN

## 2023-06-11 ENCOUNTER — Telehealth: Payer: Self-pay

## 2023-06-14 ENCOUNTER — Inpatient Hospital Stay: Payer: Self-pay

## 2023-06-17 ENCOUNTER — Encounter: Payer: Self-pay | Admitting: Internal Medicine

## 2023-06-18 ENCOUNTER — Telehealth: Payer: Self-pay

## 2023-06-23 ENCOUNTER — Emergency Department
Admission: EM | Admit: 2023-06-23 | Discharge: 2023-06-24 | Disposition: A | Discharge status: Home / Self Care | Attending: Emergency Medicine | Admitting: Emergency Medicine

## 2023-06-23 ENCOUNTER — Emergency Department

## 2023-06-23 DIAGNOSIS — F419 Anxiety disorder, unspecified: Secondary | ICD-10-CM | POA: Diagnosis not present

## 2023-06-23 DIAGNOSIS — F14121 Cocaine abuse with intoxication with delirium: Secondary | ICD-10-CM | POA: Diagnosis present

## 2023-06-23 DIAGNOSIS — R001 Bradycardia, unspecified: Secondary | ICD-10-CM | POA: Diagnosis not present

## 2023-06-23 DIAGNOSIS — F1110 Opioid abuse, uncomplicated: Secondary | ICD-10-CM | POA: Diagnosis not present

## 2023-06-23 DIAGNOSIS — F17210 Nicotine dependence, cigarettes, uncomplicated: Secondary | ICD-10-CM | POA: Diagnosis not present

## 2023-06-23 DIAGNOSIS — F411 Generalized anxiety disorder: Secondary | ICD-10-CM | POA: Diagnosis not present

## 2023-06-23 DIAGNOSIS — F12121 Cannabis abuse with intoxication delirium: Secondary | ICD-10-CM | POA: Diagnosis not present

## 2023-06-23 DIAGNOSIS — F4310 Post-traumatic stress disorder, unspecified: Secondary | ICD-10-CM | POA: Insufficient documentation

## 2023-06-23 DIAGNOSIS — F19121 Other psychoactive substance abuse with intoxication delirium: Secondary | ICD-10-CM | POA: Diagnosis not present

## 2023-06-23 DIAGNOSIS — F19921 Other psychoactive substance use, unspecified with intoxication with delirium: Secondary | ICD-10-CM

## 2023-06-23 LAB — CBC WITH DIFFERENTIAL/PLATELET
Abs Immature Granulocytes: 0.01 10*3/uL (ref 0.00–0.07)
Basophils Absolute: 0 10*3/uL (ref 0.0–0.1)
Basophils Relative: 0 %
Eosinophils Absolute: 0 10*3/uL (ref 0.0–0.5)
Eosinophils Relative: 0 %
HCT: 44.6 % (ref 39.0–52.0)
Hemoglobin: 14.3 g/dL (ref 13.0–17.0)
Immature Granulocytes: 0 %
Lymphocytes Relative: 22 %
Lymphs Abs: 1.2 10*3/uL (ref 0.7–4.0)
MCH: 28.7 pg (ref 26.0–34.0)
MCHC: 32.1 g/dL (ref 30.0–36.0)
MCV: 89.6 fL (ref 80.0–100.0)
Monocytes Absolute: 0.7 10*3/uL (ref 0.1–1.0)
Monocytes Relative: 13 %
Neutro Abs: 3.5 10*3/uL (ref 1.7–7.7)
Neutrophils Relative %: 65 %
Platelets: 140 10*3/uL — ABNORMAL LOW (ref 150–400)
RBC: 4.98 MIL/uL (ref 4.22–5.81)
RDW: 14.3 % (ref 11.5–15.5)
WBC: 5.5 10*3/uL (ref 4.0–10.5)
nRBC: 0 % (ref 0.0–0.2)

## 2023-06-23 LAB — URINALYSIS, W/ REFLEX TO CULTURE (INFECTION SUSPECTED)
Bacteria, UA: NONE SEEN
Bilirubin Urine: NEGATIVE
Glucose, UA: NEGATIVE mg/dL
Hgb urine dipstick: NEGATIVE
Ketones, ur: 5 mg/dL — AB
Leukocytes,Ua: NEGATIVE
Nitrite: NEGATIVE
Protein, ur: 100 mg/dL — AB
Specific Gravity, Urine: 1.031 — ABNORMAL HIGH (ref 1.005–1.030)
pH: 5 (ref 5.0–8.0)

## 2023-06-23 LAB — URINE DRUG SCREEN, QUALITATIVE (ARMC ONLY)
Amphetamines, Ur Screen: NOT DETECTED
Barbiturates, Ur Screen: NOT DETECTED
Benzodiazepine, Ur Scrn: POSITIVE — AB
Cannabinoid 50 Ng, Ur ~~LOC~~: POSITIVE — AB
Cocaine Metabolite,Ur ~~LOC~~: POSITIVE — AB
MDMA (Ecstasy)Ur Screen: NOT DETECTED
Methadone Scn, Ur: NOT DETECTED
Opiate, Ur Screen: NOT DETECTED
Phencyclidine (PCP) Ur S: NOT DETECTED
Tricyclic, Ur Screen: NOT DETECTED

## 2023-06-23 LAB — COMPREHENSIVE METABOLIC PANEL WITH GFR
ALT: 45 U/L — ABNORMAL HIGH (ref 0–44)
AST: 34 U/L (ref 15–41)
Albumin: 4.3 g/dL (ref 3.5–5.0)
Alkaline Phosphatase: 66 U/L (ref 38–126)
Anion gap: 12 (ref 5–15)
BUN: 12 mg/dL (ref 6–20)
CO2: 26 mmol/L (ref 22–32)
Calcium: 8.9 mg/dL (ref 8.9–10.3)
Chloride: 101 mmol/L (ref 98–111)
Creatinine, Ser: 0.94 mg/dL (ref 0.61–1.24)
GFR, Estimated: >60 mL/min (ref >60)
Glucose, Bld: 114 mg/dL — ABNORMAL HIGH (ref 70–99)
Potassium: 3.4 mmol/L — ABNORMAL LOW (ref 3.5–5.1)
Sodium: 139 mmol/L (ref 135–145)
Total Bilirubin: 1 mg/dL (ref 0.0–1.2)
Total Protein: 7.6 g/dL (ref 6.5–8.1)

## 2023-06-23 LAB — MAGNESIUM: Magnesium: 2.2 mg/dL (ref 1.7–2.4)

## 2023-06-23 LAB — TROPONIN I (HIGH SENSITIVITY): Troponin I (High Sensitivity): 3 ng/L (ref <18)

## 2023-06-23 MED ORDER — ONDANSETRON 4 MG PO TBDP
4.0000 mg | ORAL_TABLET | Freq: Once | ORAL | Status: AC
  Administered 2023-06-23: 4 mg via ORAL
  Filled 2023-06-23: qty 1

## 2023-06-23 MED ORDER — SODIUM CHLORIDE 0.9 % IV BOLUS
1000.0000 mL | Freq: Once | INTRAVENOUS | Status: AC
  Administered 2023-06-23: 1000 mL via INTRAVENOUS

## 2023-06-23 NOTE — ED Notes (Signed)
 CCMD contacted

## 2023-06-23 NOTE — ED Notes (Signed)
 Pt brought to RM 10 in wheel chair with head back as if asleep. Pt awakens to voice. Pt stating to leave him alone when care provided and stating no one is trying to help him when staff is attempting to assist to bed and apply monitor. Pt to bed, fall alarm placed. CB in reach and monitoring applied. ETCO2 placed and monitor malfunctioning. IT ticket requested to be placed to Diplomatic Services operational officer. MD notified.

## 2023-06-23 NOTE — ED Notes (Signed)
 Patient to CT.

## 2023-06-23 NOTE — ED Notes (Signed)
 Psych team called to let this RN know that the provider is running behind. Pt's psych consult will now be at 0030 on 5/29.

## 2023-06-23 NOTE — ED Notes (Signed)
 Pt called this nurse in and c/o his "body shutting down". Pt VSS. MD Rey notified and she stated his behavior and complaints were observed by herself. No new orders. Awaiting psych staff to contact.

## 2023-06-23 NOTE — ED Triage Notes (Addendum)
 Pt to ED via POV from friends house. 2 friends brought pt in. Friends reports pt minimally responsive and thought he was withdrawing. Pt reports snorted 3 lines of fentanyl . Pt responsive to verbal stimuli but lethargic. Pt HR 40s in triage. Pt reports has IVC filter in place and has PE and is not on blood thinners. PT unsure if he took something else
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
## 2023-06-23 NOTE — ED Notes (Signed)
 Charge RN Sherian Dimitri in with patient.

## 2023-06-23 NOTE — ED Notes (Signed)
 Attempt made to dress out and move the patient to the quad. Patient refused. Sherian Dimitri charge RN notified and to see the patient.

## 2023-06-23 NOTE — BH Assessment (Signed)
 Iris consult has been requested at this time. Consult time provided for 23:00 EST.

## 2023-06-23 NOTE — ED Notes (Signed)
 RN received report on pt at this time. Pt planned to move after being changed out.

## 2023-06-24 ENCOUNTER — Encounter: Payer: Self-pay | Admitting: Psychiatry

## 2023-06-24 DIAGNOSIS — F1110 Opioid abuse, uncomplicated: Secondary | ICD-10-CM

## 2023-06-24 DIAGNOSIS — F4310 Post-traumatic stress disorder, unspecified: Secondary | ICD-10-CM

## 2023-06-24 DIAGNOSIS — F411 Generalized anxiety disorder: Secondary | ICD-10-CM

## 2023-06-24 MED ORDER — SERTRALINE HCL 50 MG PO TABS: 50.0000 mg | ORAL_TABLET | Freq: Every day | ORAL | 1 refills | Status: AC

## 2023-06-24 MED ORDER — TRIHEXYPHENIDYL HCL 2 MG PO TABS: 1.0000 mg | ORAL_TABLET | Freq: Every day | ORAL | 1 refills | Status: AC

## 2023-06-24 MED ORDER — OXCARBAZEPINE 300 MG PO TABS: 300.0000 mg | ORAL_TABLET | Freq: Two times a day (BID) | ORAL | 1 refills | Status: AC

## 2023-06-24 MED ORDER — BUPROPION HCL ER (XL) 150 MG PO TB24: 150.0000 mg | ORAL_TABLET | ORAL | 1 refills | Status: AC
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
## 2023-06-24 NOTE — ED Notes (Signed)
Pt given phone to call for ride.

## 2023-06-24 NOTE — ED Provider Notes (Signed)
 2:58 AM  Pt seen and cleared by psychiatry.  They recommend the following medication changes.   "trihexyphenidyl 2 mg 1/2 tablet at bedtime for nightmares, Zoloft  50 mg daily for anxiety, Wellbutrin XL 150 mg daily for depression, anxiety, Trileptal 300 mg bid for mood stabilization, anxiety "  Prescriptions provided.  Also provided with outpatient resources.   Dabria Wadas, Clover Dao, DO 06/24/23 [phone]

## 2023-06-24 NOTE — BH Assessment (Signed)
 Writer provided pt with outpatient treatment options and 12 step resources.

## 2023-06-28 ENCOUNTER — Inpatient Hospital Stay: Payer: Self-pay | Attending: Oncology

## 2023-06-28 ENCOUNTER — Inpatient Hospital Stay: Payer: Self-pay | Admitting: Oncology

## 2023-07-01 ENCOUNTER — Telehealth: Payer: Self-pay

## 2023-07-23 ENCOUNTER — Ambulatory Visit: Admitting: Family Medicine

## 2024-01-04 ENCOUNTER — Other Ambulatory Visit: Payer: Self-pay

## 2024-01-04 ENCOUNTER — Emergency Department (HOSPITAL_COMMUNITY)

## 2024-01-04 ENCOUNTER — Encounter (HOSPITAL_COMMUNITY): Payer: Self-pay | Admitting: Emergency Medicine

## 2024-01-04 ENCOUNTER — Emergency Department (HOSPITAL_COMMUNITY)
Admission: EM | Admit: 2024-01-04 | Discharge: 2024-01-05 | Disposition: A | Attending: Emergency Medicine | Admitting: Emergency Medicine

## 2024-01-04 DIAGNOSIS — R079 Chest pain, unspecified: Secondary | ICD-10-CM

## 2024-01-04 HISTORY — DX: Other pulmonary embolism without acute cor pulmonale: I26.99

## 2024-01-04 LAB — CBC
HCT: 37.6 % — ABNORMAL LOW (ref 39.0–52.0)
Hemoglobin: 12.4 g/dL — ABNORMAL LOW (ref 13.0–17.0)
MCH: 30.1 pg (ref 26.0–34.0)
MCHC: 33 g/dL (ref 30.0–36.0)
MCV: 91.3 fL (ref 80.0–100.0)
Platelets: 141 K/uL — ABNORMAL LOW (ref 150–400)
RBC: 4.12 MIL/uL — ABNORMAL LOW (ref 4.22–5.81)
RDW: 13.4 % (ref 11.5–15.5)
WBC: 5.8 K/uL (ref 4.0–10.5)
nRBC: 0 % (ref 0.0–0.2)

## 2024-01-04 LAB — BASIC METABOLIC PANEL WITH GFR
Anion gap: 11 (ref 5–15)
BUN: <5 mg/dL — ABNORMAL LOW (ref 6–20)
CO2: 28 mmol/L (ref 22–32)
Calcium: 9.1 mg/dL (ref 8.9–10.3)
Chloride: 99 mmol/L (ref 98–111)
Creatinine, Ser: 0.76 mg/dL (ref 0.61–1.24)
GFR, Estimated: >60 mL/min (ref >60)
Glucose, Bld: 101 mg/dL — ABNORMAL HIGH (ref 70–99)
Potassium: 3.4 mmol/L — ABNORMAL LOW (ref 3.5–5.1)
Sodium: 138 mmol/L (ref 135–145)

## 2024-01-04 LAB — TROPONIN I (HIGH SENSITIVITY)
Troponin I (High Sensitivity): 5 ng/L (ref <18)
Troponin I (High Sensitivity): 5 ng/L (ref <18)

## 2024-01-04 LAB — I-STAT CHEM 8, ED
BUN: 3 mg/dL — ABNORMAL LOW (ref 6–20)
Calcium, Ion: 1.14 mmol/L — ABNORMAL LOW (ref 1.15–1.40)
Chloride: 96 mmol/L — ABNORMAL LOW (ref 98–111)
Creatinine, Ser: 0.9 mg/dL (ref 0.61–1.24)
Glucose, Bld: 104 mg/dL — ABNORMAL HIGH (ref 70–99)
HCT: 38 % — ABNORMAL LOW (ref 39.0–52.0)
Hemoglobin: 12.9 g/dL — ABNORMAL LOW (ref 13.0–17.0)
Potassium: 3.3 mmol/L — ABNORMAL LOW (ref 3.5–5.1)
Sodium: 138 mmol/L (ref 135–145)
TCO2: 29 mmol/L (ref 22–32)

## 2024-01-04 MED ORDER — DIAZEPAM 5 MG/ML IJ SOLN
2.5000 mg | Freq: Once | INTRAMUSCULAR | Status: AC
  Administered 2024-01-04: 2.5 mg via INTRAVENOUS
  Filled 2024-01-04: qty 2

## 2024-01-04 MED ORDER — ALBUTEROL SULFATE HFA 108 (90 BASE) MCG/ACT IN AERS
2.0000 | INHALATION_SPRAY | Freq: Once | RESPIRATORY_TRACT | Status: AC
  Administered 2024-01-04: 2 via RESPIRATORY_TRACT
  Filled 2024-01-04: qty 6.7

## 2024-01-04 MED ORDER — IOHEXOL 350 MG/ML SOLN
75.0000 mL | Freq: Once | INTRAVENOUS | Status: AC | PRN
  Administered 2024-01-04: 75 mL via INTRAVENOUS

## 2024-01-04 MED ORDER — APIXABAN (ELIQUIS) VTE STARTER PACK (10MG AND 5MG)
ORAL_TABLET | ORAL | 0 refills | Status: AC
  Filled 2024-01-04: qty 74, 28d supply, fill #0

## 2024-01-04 NOTE — ED Provider Triage Note (Signed)
 Emergency Medicine Provider Triage Evaluation Note   Todd Ware is a 38 y.o. 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  Patient reports that he has been out of his Eliquis  for the past 3 months and has been doing well up until about 4 days ago when he started having similar pain to his previous PE with associated shortness of breath and pleuritic chest pain.  He also feels pressure in his chest and has exertional dyspnea.  No syncope.  No hemoptysis..  Review of Systems  Positive: cp Negative: loc  Physical Exam  BP (!) 144/96   Pulse (!) 46   Temp 97.6 F (36.4 C) (Oral)   Resp (!) 24   SpO2 97%  Gen:   Awake, no distress   Resp:  Normal effort  MSK:   Moves extremities without difficulty  Other:  anxious  Medical Decision Making  Medically screening exam initiated at 6:02 PM.  Appropriate orders placed.  Todd Ware was informed that the remainder of the evaluation will be completed by another provider, this initial triage assessment does not replace that evaluation, and the importance of remaining in the ED until their evaluation is complete.     Arloa Chroman, PA-C 01/04/24 1805

## 2024-01-04 NOTE — ED Notes (Signed)
 Pt complaining that it is getting harder to breathe. Vitals retaken. Sort RN made aware.
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
## 2024-01-04 NOTE — Discharge Instructions (Addendum)
 Cardiac work-up today was normal.  No findings of PE on your CT scan. We are re-starting your eliquis -- take as directed. You need to follow-up with hematology-- call to arrange appt. You were previously scheduled to see Dr. Melanee. Return here for new concerns.

## 2024-01-05 ENCOUNTER — Other Ambulatory Visit: Payer: Self-pay

## 2024-01-06 ENCOUNTER — Telehealth: Payer: Self-pay
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
## 2024-01-18 ENCOUNTER — Other Ambulatory Visit: Payer: Self-pay
